# Patient Record
Sex: Female | Born: 1975
Health system: Southern US, Community
[De-identification: ages and names within clinical notes are randomized; demographics above are authoritative.]

## PROBLEM LIST (undated history)

## (undated) DIAGNOSIS — N83209 Unspecified ovarian cyst, unspecified side: Secondary | ICD-10-CM

## (undated) DIAGNOSIS — J309 Allergic rhinitis, unspecified: Secondary | ICD-10-CM

## (undated) DIAGNOSIS — D649 Anemia, unspecified: Secondary | ICD-10-CM

## (undated) DIAGNOSIS — I10 Essential (primary) hypertension: Secondary | ICD-10-CM

## (undated) HISTORY — DX: Essential (primary) hypertension: I10

## (undated) HISTORY — DX: Allergic rhinitis, unspecified: J30.9

## (undated) HISTORY — DX: Unspecified ovarian cyst, unspecified side: N83.209

## (undated) HISTORY — PX: NO PAST SURGERIES: SHX2092

## (undated) HISTORY — DX: Anemia, unspecified: D64.9

---

## 1998-03-10 ENCOUNTER — Encounter: Admission: RE | Admit: 1998-03-10 | Discharge: 1998-03-10 | Payer: Self-pay | Admitting: Sports Medicine

## 1998-04-29 ENCOUNTER — Encounter: Admission: RE | Admit: 1998-04-29 | Discharge: 1998-04-29 | Payer: Self-pay | Admitting: Family Medicine

## 1998-05-08 ENCOUNTER — Encounter: Admission: RE | Admit: 1998-05-08 | Discharge: 1998-05-08 | Payer: Self-pay | Admitting: Family Medicine

## 1998-09-21 ENCOUNTER — Encounter: Admission: RE | Admit: 1998-09-21 | Discharge: 1998-09-21 | Payer: Self-pay | Admitting: Family Medicine

## 1999-01-04 ENCOUNTER — Other Ambulatory Visit: Admission: RE | Admit: 1999-01-04 | Discharge: 1999-01-04 | Payer: Self-pay | Admitting: *Deleted

## 1999-01-04 ENCOUNTER — Encounter: Admission: RE | Admit: 1999-01-04 | Discharge: 1999-01-04 | Payer: Self-pay | Admitting: Family Medicine

## 1999-04-23 ENCOUNTER — Encounter: Admission: RE | Admit: 1999-04-23 | Discharge: 1999-04-23 | Payer: Self-pay | Admitting: Family Medicine

## 1999-05-13 ENCOUNTER — Encounter: Admission: RE | Admit: 1999-05-13 | Discharge: 1999-05-13 | Payer: Self-pay | Admitting: Family Medicine

## 2000-10-02 ENCOUNTER — Encounter: Admission: RE | Admit: 2000-10-02 | Discharge: 2000-10-02 | Payer: Self-pay | Admitting: Family Medicine

## 2000-11-29 ENCOUNTER — Other Ambulatory Visit: Admission: RE | Admit: 2000-11-29 | Discharge: 2000-11-29 | Payer: Self-pay | Admitting: *Deleted

## 2000-11-29 ENCOUNTER — Encounter: Admission: RE | Admit: 2000-11-29 | Discharge: 2000-11-29 | Payer: Self-pay | Admitting: Family Medicine

## 2000-12-07 ENCOUNTER — Encounter: Admission: RE | Admit: 2000-12-07 | Discharge: 2000-12-07 | Payer: Self-pay | Admitting: Family Medicine

## 2000-12-15 ENCOUNTER — Encounter: Admission: RE | Admit: 2000-12-15 | Discharge: 2000-12-15 | Payer: Self-pay | Admitting: Family Medicine

## 2000-12-20 ENCOUNTER — Ambulatory Visit (HOSPITAL_COMMUNITY): Admission: RE | Admit: 2000-12-20 | Discharge: 2000-12-20 | Payer: Self-pay | Admitting: Obstetrics

## 2001-01-09 ENCOUNTER — Encounter: Admission: RE | Admit: 2001-01-09 | Discharge: 2001-01-09 | Payer: Self-pay | Admitting: Family Medicine

## 2001-02-07 ENCOUNTER — Encounter: Admission: RE | Admit: 2001-02-07 | Discharge: 2001-02-07 | Payer: Self-pay | Admitting: Family Medicine

## 2001-02-08 ENCOUNTER — Encounter: Admission: RE | Admit: 2001-02-08 | Discharge: 2001-02-08 | Payer: Self-pay | Admitting: Family Medicine

## 2001-03-09 ENCOUNTER — Encounter: Admission: RE | Admit: 2001-03-09 | Discharge: 2001-03-09 | Payer: Self-pay | Admitting: Family Medicine

## 2001-03-11 ENCOUNTER — Inpatient Hospital Stay (HOSPITAL_COMMUNITY): Admission: AD | Admit: 2001-03-11 | Discharge: 2001-03-11 | Payer: Self-pay | Admitting: Obstetrics & Gynecology

## 2001-03-26 ENCOUNTER — Encounter: Admission: RE | Admit: 2001-03-26 | Discharge: 2001-03-26 | Payer: Self-pay | Admitting: Family Medicine

## 2001-04-11 ENCOUNTER — Encounter: Admission: RE | Admit: 2001-04-11 | Discharge: 2001-04-11 | Payer: Self-pay | Admitting: Family Medicine

## 2001-04-16 ENCOUNTER — Encounter: Admission: RE | Admit: 2001-04-16 | Discharge: 2001-04-16 | Payer: Self-pay | Admitting: Family Medicine

## 2001-04-24 ENCOUNTER — Encounter: Admission: RE | Admit: 2001-04-24 | Discharge: 2001-04-24 | Payer: Self-pay | Admitting: Family Medicine

## 2001-05-02 ENCOUNTER — Encounter: Admission: RE | Admit: 2001-05-02 | Discharge: 2001-05-02 | Payer: Self-pay | Admitting: Family Medicine

## 2001-05-07 ENCOUNTER — Encounter: Admission: RE | Admit: 2001-05-07 | Discharge: 2001-05-07 | Payer: Self-pay | Admitting: Family Medicine

## 2001-05-08 ENCOUNTER — Encounter: Admission: RE | Admit: 2001-05-08 | Discharge: 2001-05-08 | Payer: Self-pay | Admitting: Family Medicine

## 2001-05-10 ENCOUNTER — Inpatient Hospital Stay (HOSPITAL_COMMUNITY): Admission: AD | Admit: 2001-05-10 | Discharge: 2001-05-13 | Payer: Self-pay | Admitting: Obstetrics & Gynecology

## 2001-05-24 ENCOUNTER — Encounter: Admission: RE | Admit: 2001-05-24 | Discharge: 2001-05-24 | Payer: Self-pay | Admitting: Family Medicine

## 2001-05-31 ENCOUNTER — Encounter (INDEPENDENT_AMBULATORY_CARE_PROVIDER_SITE_OTHER): Payer: Self-pay | Admitting: *Deleted

## 2001-05-31 LAB — CONVERTED CEMR LAB

## 2001-06-29 ENCOUNTER — Other Ambulatory Visit: Admission: RE | Admit: 2001-06-29 | Discharge: 2001-06-29 | Payer: Self-pay | Admitting: Family Medicine

## 2001-06-29 ENCOUNTER — Encounter: Admission: RE | Admit: 2001-06-29 | Discharge: 2001-06-29 | Payer: Self-pay | Admitting: Family Medicine

## 2002-05-07 ENCOUNTER — Encounter: Admission: RE | Admit: 2002-05-07 | Discharge: 2002-05-07 | Payer: Self-pay | Admitting: Family Medicine

## 2002-09-17 ENCOUNTER — Encounter: Admission: RE | Admit: 2002-09-17 | Discharge: 2002-09-17 | Payer: Self-pay | Admitting: Family Medicine

## 2002-09-19 ENCOUNTER — Encounter: Payer: Self-pay | Admitting: Sports Medicine

## 2002-09-19 ENCOUNTER — Encounter: Admission: RE | Admit: 2002-09-19 | Discharge: 2002-09-19 | Payer: Self-pay | Admitting: Sports Medicine

## 2003-01-02 ENCOUNTER — Encounter: Admission: RE | Admit: 2003-01-02 | Discharge: 2003-01-02 | Payer: Self-pay | Admitting: Family Medicine

## 2003-01-07 ENCOUNTER — Encounter: Payer: Self-pay | Admitting: Sports Medicine

## 2003-01-07 ENCOUNTER — Encounter: Admission: RE | Admit: 2003-01-07 | Discharge: 2003-01-07 | Payer: Self-pay | Admitting: Sports Medicine

## 2003-06-18 ENCOUNTER — Ambulatory Visit (HOSPITAL_COMMUNITY): Admission: RE | Admit: 2003-06-18 | Discharge: 2003-06-18 | Payer: Self-pay | Admitting: Obstetrics & Gynecology

## 2003-06-18 ENCOUNTER — Encounter: Payer: Self-pay | Admitting: Obstetrics & Gynecology

## 2003-08-20 ENCOUNTER — Encounter: Payer: Self-pay | Admitting: Obstetrics & Gynecology

## 2003-08-20 ENCOUNTER — Ambulatory Visit (HOSPITAL_COMMUNITY): Admission: RE | Admit: 2003-08-20 | Discharge: 2003-08-20 | Payer: Self-pay | Admitting: Obstetrics & Gynecology

## 2003-10-06 ENCOUNTER — Inpatient Hospital Stay (HOSPITAL_COMMUNITY): Admission: AD | Admit: 2003-10-06 | Discharge: 2003-10-06 | Payer: Self-pay | Admitting: Obstetrics & Gynecology

## 2003-10-10 ENCOUNTER — Inpatient Hospital Stay (HOSPITAL_COMMUNITY): Admission: AD | Admit: 2003-10-10 | Discharge: 2003-10-10 | Payer: Self-pay | Admitting: Obstetrics & Gynecology

## 2003-11-03 ENCOUNTER — Ambulatory Visit (HOSPITAL_COMMUNITY): Admission: RE | Admit: 2003-11-03 | Discharge: 2003-11-03 | Payer: Self-pay | Admitting: Obstetrics & Gynecology

## 2004-01-11 ENCOUNTER — Inpatient Hospital Stay (HOSPITAL_COMMUNITY): Admission: AD | Admit: 2004-01-11 | Discharge: 2004-01-13 | Payer: Self-pay | Admitting: Obstetrics

## 2004-01-27 ENCOUNTER — Encounter: Admission: RE | Admit: 2004-01-27 | Discharge: 2004-02-26 | Payer: Self-pay | Admitting: Obstetrics & Gynecology

## 2004-02-27 ENCOUNTER — Encounter: Admission: RE | Admit: 2004-02-27 | Discharge: 2004-03-28 | Payer: Self-pay | Admitting: Obstetrics & Gynecology

## 2004-04-28 ENCOUNTER — Encounter: Admission: RE | Admit: 2004-04-28 | Discharge: 2004-05-28 | Payer: Self-pay | Admitting: Obstetrics & Gynecology

## 2004-06-28 ENCOUNTER — Encounter: Admission: RE | Admit: 2004-06-28 | Discharge: 2004-07-28 | Payer: Self-pay | Admitting: Obstetrics & Gynecology

## 2004-07-29 ENCOUNTER — Encounter: Admission: RE | Admit: 2004-07-29 | Discharge: 2004-08-28 | Payer: Self-pay | Admitting: Obstetrics & Gynecology

## 2004-09-28 ENCOUNTER — Encounter: Admission: RE | Admit: 2004-09-28 | Discharge: 2004-10-28 | Payer: Self-pay | Admitting: Obstetrics & Gynecology

## 2004-11-28 ENCOUNTER — Encounter: Admission: RE | Admit: 2004-11-28 | Discharge: 2004-12-28 | Payer: Self-pay | Admitting: Obstetrics & Gynecology

## 2004-12-29 ENCOUNTER — Encounter: Admission: RE | Admit: 2004-12-29 | Discharge: 2005-01-28 | Payer: Self-pay | Admitting: Obstetrics & Gynecology

## 2005-01-29 ENCOUNTER — Encounter: Admission: RE | Admit: 2005-01-29 | Discharge: 2005-02-28 | Payer: Self-pay | Admitting: Obstetrics & Gynecology

## 2005-03-31 ENCOUNTER — Encounter: Admission: RE | Admit: 2005-03-31 | Discharge: 2005-04-30 | Payer: Self-pay | Admitting: Obstetrics & Gynecology

## 2005-04-12 ENCOUNTER — Other Ambulatory Visit: Admission: RE | Admit: 2005-04-12 | Discharge: 2005-04-12 | Payer: Self-pay | Admitting: Obstetrics and Gynecology

## 2005-05-31 ENCOUNTER — Encounter: Admission: RE | Admit: 2005-05-31 | Discharge: 2005-06-30 | Payer: Self-pay | Admitting: Obstetrics & Gynecology

## 2005-06-10 ENCOUNTER — Inpatient Hospital Stay (HOSPITAL_COMMUNITY): Admission: AD | Admit: 2005-06-10 | Discharge: 2005-06-10 | Payer: Self-pay | Admitting: Obstetrics and Gynecology

## 2005-07-01 ENCOUNTER — Encounter: Admission: RE | Admit: 2005-07-01 | Discharge: 2005-07-30 | Payer: Self-pay | Admitting: Obstetrics & Gynecology

## 2005-07-06 ENCOUNTER — Inpatient Hospital Stay (HOSPITAL_COMMUNITY): Admission: AD | Admit: 2005-07-06 | Discharge: 2005-07-06 | Payer: Self-pay | Admitting: Obstetrics and Gynecology

## 2005-07-31 ENCOUNTER — Encounter: Admission: RE | Admit: 2005-07-31 | Discharge: 2005-08-30 | Payer: Self-pay | Admitting: Obstetrics & Gynecology

## 2005-08-31 ENCOUNTER — Encounter: Admission: RE | Admit: 2005-08-31 | Discharge: 2005-09-29 | Payer: Self-pay | Admitting: Obstetrics & Gynecology

## 2005-09-30 ENCOUNTER — Encounter: Admission: RE | Admit: 2005-09-30 | Discharge: 2005-10-30 | Payer: Self-pay | Admitting: Obstetrics & Gynecology

## 2005-10-31 ENCOUNTER — Encounter: Admission: RE | Admit: 2005-10-31 | Discharge: 2005-11-30 | Payer: Self-pay | Admitting: Obstetrics & Gynecology

## 2005-12-01 ENCOUNTER — Encounter: Admission: RE | Admit: 2005-12-01 | Discharge: 2005-12-28 | Payer: Self-pay | Admitting: Obstetrics & Gynecology

## 2005-12-29 ENCOUNTER — Encounter: Admission: RE | Admit: 2005-12-29 | Discharge: 2006-01-28 | Payer: Self-pay | Admitting: Obstetrics & Gynecology

## 2006-01-29 ENCOUNTER — Encounter: Admission: RE | Admit: 2006-01-29 | Discharge: 2006-02-28 | Payer: Self-pay | Admitting: Obstetrics & Gynecology

## 2006-12-28 DIAGNOSIS — I10 Essential (primary) hypertension: Secondary | ICD-10-CM | POA: Insufficient documentation

## 2006-12-28 DIAGNOSIS — D649 Anemia, unspecified: Secondary | ICD-10-CM | POA: Insufficient documentation

## 2006-12-28 DIAGNOSIS — N83209 Unspecified ovarian cyst, unspecified side: Secondary | ICD-10-CM | POA: Insufficient documentation

## 2006-12-29 ENCOUNTER — Encounter (INDEPENDENT_AMBULATORY_CARE_PROVIDER_SITE_OTHER): Payer: Self-pay | Admitting: *Deleted

## 2007-06-22 ENCOUNTER — Encounter: Admission: RE | Admit: 2007-06-22 | Discharge: 2007-06-22 | Payer: Self-pay | Admitting: Family Medicine

## 2007-10-08 ENCOUNTER — Other Ambulatory Visit: Admission: RE | Admit: 2007-10-08 | Discharge: 2007-10-08 | Payer: Self-pay | Admitting: Family Medicine

## 2010-03-25 ENCOUNTER — Other Ambulatory Visit: Admission: RE | Admit: 2010-03-25 | Discharge: 2010-03-25 | Payer: Self-pay | Admitting: Internal Medicine

## 2010-03-25 ENCOUNTER — Ambulatory Visit: Payer: Self-pay | Admitting: Internal Medicine

## 2010-03-25 DIAGNOSIS — J309 Allergic rhinitis, unspecified: Secondary | ICD-10-CM | POA: Insufficient documentation

## 2010-03-25 LAB — CONVERTED CEMR LAB: Pap Smear: NEGATIVE

## 2010-03-25 LAB — HM PAP SMEAR

## 2010-03-26 ENCOUNTER — Telehealth: Payer: Self-pay | Admitting: Internal Medicine

## 2010-03-26 LAB — CONVERTED CEMR LAB
ALT: 58 units/L — ABNORMAL HIGH (ref 0–35)
AST: 47 units/L — ABNORMAL HIGH (ref 0–37)
Albumin: 4.1 g/dL (ref 3.5–5.2)
Alkaline Phosphatase: 77 units/L (ref 39–117)
BUN: 15 mg/dL (ref 6–23)
Basophils Absolute: 0 10*3/uL (ref 0.0–0.1)
Basophils Relative: 0.4 % (ref 0.0–3.0)
Bilirubin Urine: NEGATIVE
Bilirubin, Direct: 0 mg/dL (ref 0.0–0.3)
CO2: 30 meq/L (ref 19–32)
Calcium: 9.2 mg/dL (ref 8.4–10.5)
Chloride: 105 meq/L (ref 96–112)
Cholesterol: 218 mg/dL — ABNORMAL HIGH (ref 0–200)
Creatinine, Ser: 0.6 mg/dL (ref 0.4–1.2)
Direct LDL: 128.5 mg/dL
Eosinophils Absolute: 0.3 10*3/uL (ref 0.0–0.7)
Eosinophils Relative: 3.4 % (ref 0.0–5.0)
GFR calc non Af Amer: 155.49 mL/min (ref >60)
Glucose, Bld: 85 mg/dL (ref 70–99)
HCT: 36.8 % (ref 36.0–46.0)
HDL: 75.2 mg/dL (ref >39.00)
Hemoglobin, Urine: NEGATIVE
Hemoglobin: 12.5 g/dL (ref 12.0–15.0)
Ketones, ur: NEGATIVE mg/dL
Leukocytes, UA: NEGATIVE
Lymphocytes Relative: 24.7 % (ref 12.0–46.0)
Lymphs Abs: 2.2 10*3/uL (ref 0.7–4.0)
MCHC: 33.9 g/dL (ref 30.0–36.0)
MCV: 84.4 fL (ref 78.0–100.0)
Monocytes Absolute: 0.5 10*3/uL (ref 0.1–1.0)
Monocytes Relative: 6 % (ref 3.0–12.0)
Neutro Abs: 5.8 10*3/uL (ref 1.4–7.7)
Neutrophils Relative %: 65.5 % (ref 43.0–77.0)
Nitrite: NEGATIVE
Platelets: 255 10*3/uL (ref 150.0–400.0)
Potassium: 4.3 meq/L (ref 3.5–5.1)
RBC: 4.37 M/uL (ref 3.87–5.11)
RDW: 14.5 % (ref 11.5–14.6)
Sodium: 139 meq/L (ref 135–145)
Specific Gravity, Urine: 1.01 (ref 1.000–1.030)
TSH: 0.92 microintl units/mL (ref 0.35–5.50)
Total Bilirubin: 0.3 mg/dL (ref 0.3–1.2)
Total CHOL/HDL Ratio: 3
Total Protein, Urine: NEGATIVE mg/dL
Total Protein: 7.2 g/dL (ref 6.0–8.3)
Triglycerides: 41 mg/dL (ref 0.0–149.0)
Urine Glucose: NEGATIVE mg/dL
Urobilinogen, UA: 0.2 (ref 0.0–1.0)
VLDL: 8.2 mg/dL (ref 0.0–40.0)
WBC: 8.9 10*3/uL (ref 4.5–10.5)
pH: 6.5 (ref 5.0–8.0)

## 2010-08-04 ENCOUNTER — Ambulatory Visit: Payer: Self-pay | Admitting: Internal Medicine

## 2010-08-04 DIAGNOSIS — R740 Nonspecific elevation of levels of transaminase and lactic acid dehydrogenase [LDH]: Secondary | ICD-10-CM

## 2010-08-04 DIAGNOSIS — R7402 Elevation of levels of lactic acid dehydrogenase (LDH): Secondary | ICD-10-CM | POA: Insufficient documentation

## 2010-09-03 ENCOUNTER — Telehealth: Payer: Self-pay | Admitting: Internal Medicine

## 2010-09-29 ENCOUNTER — Ambulatory Visit: Payer: Self-pay | Admitting: Internal Medicine

## 2010-09-29 DIAGNOSIS — R6882 Decreased libido: Secondary | ICD-10-CM | POA: Insufficient documentation

## 2010-09-30 LAB — CONVERTED CEMR LAB
ALT: 19 units/L (ref 0–35)
AST: 20 units/L (ref 0–37)
Albumin: 3.5 g/dL (ref 3.5–5.2)
Alkaline Phosphatase: 46 units/L (ref 39–117)
Bilirubin, Direct: 0.1 mg/dL (ref 0.0–0.3)
Total Bilirubin: 0.3 mg/dL (ref 0.3–1.2)
Total Protein: 6.8 g/dL (ref 6.0–8.3)

## 2010-11-08 ENCOUNTER — Telehealth: Payer: Self-pay | Admitting: Internal Medicine

## 2010-11-15 ENCOUNTER — Ambulatory Visit
Admission: RE | Admit: 2010-11-15 | Discharge: 2010-11-15 | Payer: Self-pay | Source: Home / Self Care | Attending: Internal Medicine | Admitting: Internal Medicine

## 2010-12-02 NOTE — Assessment & Plan Note (Signed)
Summary: ?sinus inf/cd   Vital Signs:  Patient profile:   35 year old female Height:      6 inches (15.24 cm) Weight:      142 pounds (64.55 kg) O2 Sat:      99 % on Room air Temp:     99.3 degrees F (37.39 degrees C) oral Pulse rate:   115 / minute BP sitting:   102 / 70  (left arm) Cuff size:   regular  Vitals Entered By: Orlan Leavens RMA (August 04, 2010 1:12 PM)  O2 Flow:  Room air CC: ? Sinus infection, URI symptoms Is Patient Diabetic? No Pain Assessment Patient in pain? no        Primary Care Provider:  Newt Lukes MD  CC:  ? Sinus infection and URI symptoms.  History of Present Illness:  URI Symptoms      This is a 35 year old woman who presents with URI symptoms.  The symptoms began 1 day ago.  The severity is described as moderate.  sudden onset left maxillary sinus and upper jaw/tooth pain - .  The patient reports nasal congestion, purulent nasal discharge, sore throat, and earache, but denies productive cough and sick contacts.  Associated symptoms include use of an antipyretic and response to antipyretic.  The patient denies fever, stiff neck, dyspnea, wheezing, rash, vomiting, and diarrhea.  The patient also reports seasonal symptoms.  The patient denies itchy watery eyes, itchy throat, sneezing, muscle aches, and severe fatigue.  Risk factors for Strep sinusitis include unilateral facial pain, unilateral nasal discharge, tooth pain, and absence of cough.    Current Medications (verified): 1)  Tribenzor 40-5-12.5 Mg Tabs (Olmesartan-Amlodipine-Hctz) .... Take 1 By Mouth Once Daily 2)  Multivitamins  Tabs (Multiple Vitamin) .Marland Kitchen.. 1 By Mouth Once Daily 3)  Perfect Iron 25 Mg Tabs (Carbonyl Iron) .Marland Kitchen.. 1 By Mouth Two Times A Day 4)  Vitamin B-12 2500 Mcg Subl (Cyanocobalamin) .Marland Kitchen.. 1 By Mouth Once Daily 5)  Yaz 3-0.02 Mg Tabs (Drospirenone-Ethinyl Estradiol) .... Use As Directed Once Daily  Allergies (verified): 1)  ! Penicillin  Past History:  Past  Medical History: Ruptured Hemorrhagic R ovarian cyst 11/03 Allergic rhinitis Hypertension Anemia-NOS mild LFTs increase 02/2010 cpx labs - no IVDA or hx hepatitis, no etoh  Review of Systems  The patient denies fever, chest pain, headaches, and abdominal pain.    Physical Exam  General:  alert, well-developed, well-nourished, and cooperative to examination.   nontoxic Head:  Normocephalic and atraumatic without obvious abnormalities. No apparent alopecia or balding. tenderness to palp over left maxillary sinus and upper jaw region Eyes:  vision grossly intact; pupils equal, round and reactive to light.  conjunctiva and lids normal.    Ears:  normal pinnae bilaterally, without erythema, swelling, or tenderness to palpation. TMs clear, without effusion, or cerumen impaction. Hearing grossly normal bilaterally  Mouth:  teeth and gums in good repair; mucous membranes moist, without lesions or ulcers. oropharynx clear without exudate, no erythema. +PND  Lungs:  normal respiratory effort, no intercostal retractions or use of accessory muscles; normal breath sounds bilaterally - no crackles and no wheezes.    Heart:  normal rate, regular rhythm, no murmur, and no rub. BLE without edema.   Impression & Recommendations:  Problem # 1:  ACUTE MAXILLARY SINUSITIS (ICD-461.0)  hx same, usually seasoanl -  abx + nasal steroids - erx done Her updated medication list for this problem includes:    Levaquin 500  Mg Tabs (Levofloxacin) .Marland Kitchen... 1 by mouth once daily x 10 days    Nasonex 50 Mcg/act Susp (Mometasone furoate) .Marland Kitchen... 1 spray each nostril  every morning  Instructed on treatment. Call if symptoms persist or worsen.   Orders: Prescription Created Electronically [phone])  Problem # 2:  NONSPEC ELEVATION OF LEVELS OF TRANSAMINASE/LDH (ICD-790.4) cpx labs 02/2010 - mild inc AST/ALT, no known hx same - no etho, no hepatitis hx, no GI symptoms  offered recheck now - will until f/u CPX labs, sooner  if symptoms - advised on need for f/u and poss abd u/s for same  Complete Medication List: 1)  Tribenzor 40-5-12.5 Mg Tabs (Olmesartan-amlodipine-hctz) .... Take 1 by mouth once daily 2)  Multivitamins Tabs (Multiple vitamin) .Marland Kitchen.. 1 by mouth once daily 3)  Perfect Iron 25 Mg Tabs (Carbonyl iron) .Marland Kitchen.. 1 by mouth two times a day 4)  Vitamin B-12 2500 Mcg Subl (Cyanocobalamin) .Marland Kitchen.. 1 by mouth once daily 5)  Yaz 3-0.02 Mg Tabs (Drospirenone-ethinyl estradiol) .... Use as directed once daily 6)  Levaquin 500 Mg Tabs (Levofloxacin) .Marland Kitchen.. 1 by mouth once daily x 10 days 7)  Nasonex 50 Mcg/act Susp (Mometasone furoate) .Marland Kitchen.. 1 spray each nostril  every morning  Patient Instructions: 1)  it was good to see you today. 2)  levaquin antibioitcs and nasonex spray for sinus symptoms - your prescriptions have been electronically submitted to your pharmacy. Please take as directed. Contact our office if you believe you're having problems with the medication(s).  3)  Get plenty of rest, drink lots of clear liquids, and use Tylenol or Ibuprofen for fever and comfort. Return in 7-10 days if you're not better:sooner if you're feeling worse. 4)  will recheck your liver tests next office visit as dicussed, call sooner if problems Prescriptions: NASONEX 50 MCG/ACT SUSP (MOMETASONE FUROATE) 1 spray each nostril  every morning  #1 x 6   Entered and Authorized by:   Newt Lukes MD   Signed by:   Newt Lukes MD on 08/04/2010   Method used:   [facility] [address] [phone]* (retail)       [address]       [place], [place]  [address]       Ph: [phone] or [phone]       Fax: [phone]   RxID:   [id number] LEVAQUIN 500 MG TABS (LEVOFLOXACIN) 1 by mouth once daily x 10 days  #10 x 0   Entered and Authorized by:   Newt Lukes MD   Signed by:   Newt Lukes MD on 08/04/2010   Method used:   [facility]        [facility] [address] [phone]* (retail)       [address]       [place], [place]  [address]       Ph: [phone] or [phone]       Fax: [phone]   RxID:   [id number]

## 2010-12-02 NOTE — Assessment & Plan Note (Signed)
Summary: NEW [employer] PT--PKG--#-STC   Vital Signs:  Patient profile:   35 year old female Height:      63 inches (160.02 cm) Weight:      142.4 pounds (64.73 kg) BMI:     25.32 O2 Sat:      99 % on Room air Temp:     99.1 degrees F (37.28 degrees C) oral Pulse rate:   110 / minute BP sitting:   132 / 88  (left arm) Cuff size:   regular  Vitals Entered By: Orlan Leavens (Mar 25, 2010 8:16 AM)  O2 Flow:  Room air CC: New patient Is Patient Diabetic? No Pain Assessment Patient in pain? no        Primary Care Provider:  Newt Lukes MD  CC:  New patient.  History of Present Illness: new pt to me and our practice - here to est care  patient is here today for annual physical. Patient feels well and has no complaints.   Preventive Screening-Counseling & Management  Alcohol-Tobacco     Alcohol drinks/day: 0     Alcohol Counseling: not indicated; use of alcohol is not excessive or problematic     Smoking Status: never     Tobacco Counseling: not indicated; no tobacco use  Caffeine-Diet-Exercise     Caffeine Counseling: not indicated; caffeine use is not excessive or problematic     Diet Counseling: not indicated; diet is assessed to be healthy     Does Patient Exercise: yes     Exercise Counseling: not indicated; exercise is adequate  Safety-Violence-Falls     Seat Belt Use: yes     Seat Belt Counseling: not indicated; patient wears seat belts     Helmet Counseling: not applicable     Firearms in the Home: no firearms in the home     Firearm Counseling: not applicable     Smoke Detectors: yes     Smoke Detector Counseling: n/a     Violence Counseling: not indicated; no violence risk noted     Sexual Abuse Counseling: no     Fall Risk Counseling: not indicated; no significant falls noted  Clinical Review Panels:  Prevention   Last Pap Smear:  Done. (05/31/2001)  Immunizations   Last Tetanus Booster:  Done. (02/28/1989)   Current Medications (verified): 1)   Tribenzor 40-5-12.5 Mg Tabs (Olmesartan-Amlodipine-Hctz) .... Take 1 By Mouth Once Daily  Allergies (verified): 1)  ! Penicillin  Past History:  Past Medical History: Ruptured Hemorrhagic R ovarian cyst 11/03 Allergic rhinitis Hypertension Anemia-NOS  Past Surgical History: Denies surgical history  Family History: father had prostate cancer. Mother alive and healthy. Family History Hypertension parent, other relative) Family History Diabetes 1st degree relative (cousin)  Social History: married, lives with spouse and 3 children: Christiane Ha and [place]. Married. No drugs. No ETOH. No smoking. 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 I have reviewed all other systems and they were negative.   Physical Exam  General:  alert, well-developed, well-nourished, and cooperative to examination.    Eyes:  vision grossly intact; pupils equal, round and reactive to light.  conjunctiva and lids normal.    Ears:  normal pinnae bilaterally, without erythema, swelling, or tenderness to palpation. TMs clear, without effusion, or cerumen impaction. Hearing  grossly normal bilaterally  Mouth:  teeth and gums in good repair; mucous membranes moist, without lesions or ulcers. oropharynx clear without exudate, no erythema.  Neck:  supple, full ROM, no masses, no thyromegaly; no thyroid nodules or tenderness. no JVD or carotid bruits.   Lungs:  normal respiratory effort, no intercostal retractions or use of accessory muscles; normal breath sounds bilaterally - no crackles and no wheezes.    Heart:  normal rate, regular rhythm, no murmur, and no rub. BLE without edema. normal DP pulses and normal cap refill in all 4 extremities    Abdomen:  soft, non-tender, normal bowel sounds, no distention; no masses and no appreciable  hepatomegaly or splenomegaly.   Genitalia:  Pelvic Exam:        External: normal female genitalia without lesions or masses        Vagina: normal without lesions or masses        Cervix: normal without lesions or masses        Adnexa: normal bimanual exam without masses or fullness        Uterus: normal by palpation        Pap smear: performed Msk:  No deformity or scoliosis noted of thoracic or lumbar spine.   Neurologic:  alert & oriented X3 and cranial nerves II-XII symetrically intact.  strength normal in all extremities, sensation intact to light touch, and gait normal. speech fluent without dysarthria or aphasia; follows commands with good comprehension.  Skin:  no rashes, vesicles, ulcers, or erythema. No nodules or irregularity to palpation.  Psych:  Oriented X3, memory intact for recent and remote, normally interactive, good eye contact, not anxious appearing, not depressed appearing, and not agitated.      Impression & Recommendations:  Problem # 1:  PREVENTIVE HEALTH CARE (ICD-V70.0)  Patient has been counseled on age-appropriate routine health concerns for screening and prevention. These are reviewed and up-to-date. Immunizations are up-to-date or declined. Labs ordered and will be reviewed.  If no anemia or thyroid problems, consider starting OCP to help regulate fatigue symptoms mid menstral cycle  Orders: TLB-Lipid Panel (80061-LIPID) TLB-BMP (Basic Metabolic Panel-BMET) (80048-METABOL) TLB-CBC Platelet - w/Differential (85025-CBCD) TLB-Hepatic/Liver Function Pnl (80076-HEPATIC) TLB-TSH (Thyroid Stimulating Hormone) (84443-TSH) TLB-Udip w/ Micro (81001-URINE) Pap Smear, Thin Prep ( Collection of) (Z6109)  Problem # 2:  HYPERTENSION (ICD-401.9)  Her updated medication list for this problem includes:    Tribenzor 40-5-12.5 Mg Tabs (Olmesartan-amlodipine-hctz) .Marland Kitchen... Take 1 by mouth once daily  BP today: 132/88  Complete Medication List: 1)  Tribenzor 40-5-12.5 Mg Tabs  (Olmesartan-amlodipine-hctz) .... Take 1 by mouth once daily 2)  Multivitamins Tabs (Multiple vitamin) .Marland Kitchen.. 1 by mouth once daily 3)  Perfect Iron 25 Mg Tabs (Carbonyl iron) .Marland Kitchen.. 1 by mouth two times a day 4)  Vitamin B-12 2500 Mcg Subl (Cyanocobalamin) .Marland Kitchen.. 1 by mouth once daily  Patient Instructions: 1)  it was good to see you today. 2)  test(s) ordered today - your results will be posted on the phone tree for review in 48-72 hours from the time of test completion; call [phone] and enter your 9 digit MRN (listed above on this page, just below your name); if any changes need to be made or there are abnormal results, you will be contacted directly.  3)  continue vitamin supplements and blood pressure pill as discussed - 4)  will send for prior records from the [facility] - 5)  Please schedule a follow-up appointment in 6 months to monitor  blood pressure, sooner if problems.

## 2010-12-02 NOTE — Progress Notes (Signed)
Summary: Rx   Phone Note Call from Patient      New/Updated Medications: YAZ 3-0.02 MG TABS (DROSPIRENONE-ETHINYL ESTRADIOL) use as directed once daily Prescriptions: YAZ 3-0.02 MG TABS (DROSPIRENONE-ETHINYL ESTRADIOL) use as directed once daily  #30 x 5   Entered by:   Margaret Pyle, CMA   Authorized by:   Newt Lukes MD   Signed by:   Margaret Pyle, CMA on 03/26/2010   Method used:   [facility]        [facility] [address] [phone]* (retail)       [address]       [place], [place]  [address]       Ph: [phone] or [phone]       Fax: [phone]   RxID:   [id number]

## 2010-12-02 NOTE — Progress Notes (Signed)
Summary: yeast infection  Phone Note Call from Patient Call back at Home Phone [phone]   Caller: Patient Call For: Newt Lukes MD Reason for Call: Talk to Doctor Summary of Call: Pt left msg on vm stating that she is in [place] and she think she has a yeast infection. Has tried [employer], but still no relief. requesting md to rx something for yeast infection. Pls advise Initial call taken by: Orlan Leavens RMA,  September 03, 2010 10:59 AM  Follow-up for Phone Call        can erx diflucan 150mg  by mouth once daily x 3 days (if pharmacy known) Follow-up by: Newt Lukes MD,  September 03, 2010 11:34 AM  Additional Follow-up for Phone Call Additional follow up Details #1::        Called pt no ansew [facility] RTC Additional Follow-up by: Orlan Leavens RMA,  September 03, 2010 12:00 PM    Additional Follow-up for Phone Call Additional follow up Details #2::    Pt return call back [facility] [place] Called rx into [phone] spoke with Kris/pharmacist. Updated EMR Follow-up by: Orlan Leavens RMA,  September 03, 2010 2:03 PM  New/Updated Medications: FLUCONAZOLE 150 MG TABS (FLUCONAZOLE) take 1 by mouth once daily fro 3 days Prescriptions: FLUCONAZOLE 150 MG TABS (FLUCONAZOLE) take 1 by mouth once daily fro 3 days  #3 x 0   Entered by:   Orlan Leavens RMA   Authorized by:   Newt Lukes MD   Signed by:   Orlan Leavens RMA on 09/03/2010   Method used:   Historical   RxID:   [id number]

## 2010-12-02 NOTE — Assessment & Plan Note (Signed)
Summary: 6 MO ROV /NWS #   Vital Signs:  Patient profile:   35 year old female Weight:      136.8 pounds (62.18 kg) O2 Sat:      99 % on Room air Temp:     99.3 degrees F (37.39 degrees C) oral Pulse rate:   102 / minute BP sitting:   100 / 72  (left arm) Cuff size:   regular  Vitals Entered By: Orlan Leavens RMA (September 29, 2010 8:37 AM)  O2 Flow:  Room air CC: 6 month follow-up Is Patient Diabetic? No Pain Assessment Patient in pain? no        Primary Care Provider:  Newt Lukes MD  CC:  6 month follow-up.  History of Present Illness: here for f/u -  HTN - ?overcontrolled - reports compliance with ongoing medical treatment and no changes in medication dose or frequency. denies adverse side effects related to current therapy.  prev on benicar, lisinopril, labetolol - onset 2006 with complicated pregnancy - now life stress ors much less  c/o low libido -  chronic issue but progressive worse no change BCP no depression, no abuse  inc LFTs last cpx labs - no GI symptoms - no abd pain - no n/v or weight change  Clinical Review Panels:  Lipid Management   Cholesterol:  218 (03/25/2010)   HDL (good cholesterol):  75.20 (03/25/2010)  Complete Metabolic Panel   Glucose:  85 (03/25/2010)   Sodium:  139 (03/25/2010)   Potassium:  4.3 (03/25/2010)   Chloride:  105 (03/25/2010)   CO2:  30 (03/25/2010)   BUN:  15 (03/25/2010)   Creatinine:  0.6 (03/25/2010)   Albumin:  4.1 (03/25/2010)   Total Protein:  7.2 (03/25/2010)   Calcium:  9.2 (03/25/2010)   Total Bili:  0.3 (03/25/2010)   Alk Phos:  77 (03/25/2010)   SGPT (ALT):  58 (03/25/2010)   SGOT (AST):  47 (03/25/2010)   Current Medications (verified): 1)  Tribenzor 40-5-12.5 Mg Tabs (Olmesartan-Amlodipine-Hctz) .... Take 1 By Mouth Once Daily 2)  Multivitamins  Tabs (Multiple Vitamin) .Marland Kitchen.. 1 By Mouth Once Daily 3)  Perfect Iron 25 Mg Tabs (Carbonyl Iron) .Marland Kitchen.. 1 By Mouth Two Times A Day 4)  Vitamin B-12  2500 Mcg Subl (Cyanocobalamin) .Marland Kitchen.. 1 By Mouth Once Daily 5)  Gianvi 3-0.02 Mg Tabs (Drospirenone-Ethinyl Estradiol) .... As Directed 6)  Nasonex 50 Mcg/act Susp (Mometasone Furoate) .Marland Kitchen.. 1 Spray Each Nostril  Every Morning  Allergies (verified): 1)  ! Penicillin  Past History:  Past Medical History: Ruptured Hemorrhagic R ovarian cyst 11/03 Allergic rhinitis Hypertension Anemia-NOS mild LFTs increase 02/2010 cpx labs - no IVDA or hx hepatitis, no alcohol  MD roster:  gyn -  Family History: father had prostate cancer. Mother alive and healthy. Family History Hypertension  - mom and sister Family History Diabetes 1st degree relative (cousin)  Social History: married, lives with spouse and 3 children: Christiane Ha and [place]. Married. No drugs. No ETOH. 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    Physical Exam  General:  alert, well-developed, well-nourished, and cooperative to examination.   Lungs:  normal respiratory effort, no intercostal retractions or use of accessory muscles; normal breath sounds bilaterally - no crackles and no wheezes.    Heart:  normal rate, regular rhythm, no murmur, and no rub. BLE without edema. Neurologic:  alert & oriented X3 and cranial  nerves II-XII symetrically intact.  strength normal in all extremities, sensation intact to light touch, and gait normal. speech fluent without dysarthria or aphasia; follows commands with good comprehension.    Impression & Recommendations:  Problem # 1:  HYPERTENSION (ICD-401.9)  overtx without abn elev BP since tx - reduce med to monotx - Her updated medication list for this problem includes:    Benicar 20 Mg Tabs (Olmesartan medoxomil) .Marland Kitchen... 1 by mouth once daily  BP today: 100/72 Prior BP: 102/70 (08/04/2010)  Labs Reviewed: K+: 4.3 (03/25/2010) Creat: : 0.6 (03/25/2010)   Chol: 218 (03/25/2010)   HDL: 75.20 (03/25/2010)    TG: 41.0 (03/25/2010)  Orders: Prescription Created Electronically [phone])  Problem # 2:  LIBIDO, DECREASED (ICD-799.81)  no depression overlap or other physical issues refer to gyn for same as well as pelvic/PAP eval  Orders: Gynecologic Referral (Gyn)  Problem # 3:  NONSPEC ELEVATION OF LEVELS OF TRANSAMINASE/LDH (ICD-790.4)  Orders: TLB-Hepatic/Liver Function Pnl (80076-HEPATIC)  cpx labs 02/2010 - mild inc AST/ALT, no known hx same - no etho, no hepatitis hx, no GI symptoms  recheck now - advised on need for f/u and poss abd u/s for same  Complete Medication List: 1)  Benicar 20 Mg Tabs (Olmesartan medoxomil) .Marland Kitchen.. 1 by mouth once daily 2)  Multivitamins Tabs (Multiple vitamin) .Marland Kitchen.. 1 by mouth once daily 3)  Perfect Iron 25 Mg Tabs (Carbonyl iron) .Marland Kitchen.. 1 by mouth two times a day 4)  Vitamin B-12 2500 Mcg Subl (Cyanocobalamin) .Marland Kitchen.. 1 by mouth once daily 5)  Gianvi 3-0.02 Mg Tabs (Drospirenone-ethinyl estradiol) .... As directed 6)  Nasonex 50 Mcg/act Susp (Mometasone furoate) .Marland Kitchen.. 1 spray each nostril  every morning  Patient Instructions: 1)  it was good to see you today. 2)  test(s) ordered today - your results will be posted on the phone tree for review in 48-72 hours from the time of test completion; call [phone] and enter your 9 digit MRN (listed above on this page, just below your name); if any changes need to be made or there are abnormal results, you will be contacted directly.  3)  change to benicar (stop tribenzor) - your prescriptions have been electronically submitted to your pharmacy. Please take as directed. Contact our office if you believe you're having problems with the medication(s).  4)  we'll make referral to gyneocology. Our office will contact you regarding this appointment once made.  5)  Please schedule a follow-up appointment in 3 months to monitor BP, sooner if problems.  Prescriptions: BENICAR 20 MG TABS (OLMESARTAN MEDOXOMIL) 1 by mouth once daily   #30 x 6   Entered and Authorized by:   Newt Lukes MD   Signed by:   Newt Lukes MD on 09/29/2010   Method used:   [facility]        [facility] [address] [phone]* (retail)       [address]       [place], [place]  [address]       Ph: [phone] or [phone]       Fax: [phone]   RxID:   [id number]    Orders Added: 1)  TLB-Hepatic/Liver Function Pnl [80076-HEPATIC] 2)  Est. Patient Level IV [38756] 3)  Gynecologic Referral [Gyn] 4)  Prescription Created Electronically [phone]

## 2010-12-02 NOTE — Assessment & Plan Note (Signed)
Summary: SINUS INFECTION/NWS   Vital Signs:  Patient profile:   35 year old female Weight:      136 pounds (61.82 kg) O2 Sat:      97 % on Room air Temp:     99.6 degrees F (37.56 degrees C) oral Pulse rate:   84 / minute BP sitting:   110 / 72  (left arm) Cuff size:   regular  Vitals Entered By: Orlan Leavens RMA (November 15, 2010 2:22 PM)  O2 Flow:  Room air  Primary Care Provider:  Newt Lukes MD  CC:  sinus.  History of Present Illness: here for sinus symptoms  onset 1 week ago predominately on right side, under eye (maxillary) +hx same - recurrent symptoms each season change not using rx'd meds for allg riniits LGF, no ear pain, ST, no cough or SOB, no HA  also reviewed other med issues:  HTN - ?overcontrolled - reports compliance with ongoing medical treatment and no changes in medication dose or frequency. denies adverse side effects related to current therapy.  prev on benicar, lisinopril, labetolol - onset 2006 with complicated pregnancy - now life stress ors much less  c/o low libido -  chronic issue but intermit worse no change BCP no depression, no abuse   Current Medications (verified): 1)  Benicar 20 Mg Tabs (Olmesartan Medoxomil) .Marland Kitchen.. 1 By Mouth Once Daily 2)  Multivitamins  Tabs (Multiple Vitamin) .Marland Kitchen.. 1 By Mouth Once Daily 3)  Perfect Iron 25 Mg Tabs (Carbonyl Iron) .Marland Kitchen.. 1 By Mouth Two Times A Day 4)  Vitamin B-12 2500 Mcg Subl (Cyanocobalamin) .Marland Kitchen.. 1 By Mouth Once Daily 5)  Gianvi 3-0.02 Mg Tabs (Drospirenone-Ethinyl Estradiol) .... As Directed 6)  Nasonex 50 Mcg/act Susp (Mometasone Furoate) .Marland Kitchen.. 1 Spray Each Nostril  Every Morning 7)  Diflucan 150 Mg Tabs (Fluconazole) .Marland Kitchen.. 1 By Mouth X 1 For Yeast Symptoms  Allergies (verified): 1)  ! Penicillin  Past History:  Past Medical History: Ruptured Hemorrhagic R ovarian cyst 11/03 Allergic rhinitis Hypertension Anemia-NOS mild LFTs increase 02/2010 cpx labs - no IVDA or hx hepatitis, no  alcohol   normal on 09/29/10  MD roster:  gyn -   Review of Systems  The patient denies vision loss, decreased hearing, hoarseness, chest pain, syncope, and abdominal pain.         c/o mood irritability  Physical Exam  General:  alert, well-developed, well-nourished, and cooperative to examination.   Head:  Normocephalic and atraumatic without obvious abnormalities. No apparent alopecia or balding. Eyes:  vision grossly intact; pupils equal, round and reactive to light.  conjunctiva and lids normal.    Ears:  normal pinnae bilaterally, without erythema, swelling, or tenderness to palpation. TMs clear, without effusion, or cerumen impaction. Hearing grossly normal bilaterally  Nose:  tender over right max sinus region Mouth:  teeth and gums in good repair; mucous membranes moist, without lesions or ulcers. oropharynx clear without exudate, no erythema. +PND  Lungs:  normal respiratory effort, no intercostal retractions or use of accessory muscles; normal breath sounds bilaterally - no crackles and no wheezes.    Heart:  normal rate, regular rhythm, no murmur, and no rub. BLE without edema.   Impression & Recommendations:  Problem # 1:  ACUTE MAXILLARY SINUSITIS (ICD-461.0)  Her updated medication list for this problem includes:    Nasonex 50 Mcg/act Susp (Mometasone furoate) .Marland Kitchen... 1 spray each nostril  every morning    Azithromycin 250 Mg Tabs (Azithromycin) .Marland KitchenMarland KitchenMarland KitchenMarland Kitchen 2  tabs by mouth today, then 1 by mouth daily starting tomorrow  hx same, usually seasonal -  abx + nasal steroids - erx done  Instructed on treatment. Call if symptoms persist or worsen.   Orders: Prescription Created Electronically [phone])  Complete Medication List: 1)  Benicar 20 Mg Tabs (Olmesartan medoxomil) .Marland Kitchen.. 1 by mouth once daily 2)  Multivitamins Tabs (Multiple vitamin) .Marland Kitchen.. 1 by mouth once daily 3)  Perfect Iron 25 Mg Tabs (Carbonyl iron) .Marland Kitchen.. 1 by mouth two times a day 4)  Vitamin B-12 2500 Mcg Subl  (Cyanocobalamin) .Marland Kitchen.. 1 by mouth once daily 5)  Gianvi 3-0.02 Mg Tabs (Drospirenone-ethinyl estradiol) .... As directed 6)  Nasonex 50 Mcg/act Susp (Mometasone furoate) .Marland Kitchen.. 1 spray each nostril  every morning 7)  Diflucan 150 Mg Tabs (Fluconazole) .Marland Kitchen.. 1 by mouth x 1 for yeast symptoms 8)  Azithromycin 250 Mg Tabs (Azithromycin) .... 2 tabs by mouth today, then 1 by mouth daily starting tomorrow  Patient Instructions: 1)  it was good to see you today.  2)  azithromycin antibiotic for sinus symptoms  - your prescription has been electronically submitted to your pharmacy. Please take as directed. Contact our office if you believe you're having problems with the medication(s). Continue to use the nose spray 3)  Get plenty of rest, drink lots of clear liquids, and use Tylenol or Ibuprofen for fever and comfort. Return in 7-10 days if you're not better:sooner if you're feeling worse. 4)  If you'd like to consider sertraline or other medication for "irritability" as we discussed, make an appointment to condier options for treatment Prescriptions: AZITHROMYCIN 250 MG TABS (AZITHROMYCIN) 2 tabs by mouth today, then 1 by mouth daily starting tomorrow  #6 x 0   Entered and Authorized by:   Newt Lukes MD   Signed by:   Newt Lukes MD on 11/15/2010   Method used:   Electronically to        [facility] [address] [phone]* (retail)       [address]       [place], [place]  [address]       Ph: [phone] or [phone]       Fax: [phone]   RxID:   [id number]    Orders Added: 1)  Est. Patient Level IV [51884] 2)  Prescription Created Electronically [phone]

## 2010-12-02 NOTE — Progress Notes (Signed)
Summary: Yeast Inf  Phone Note Call from Patient Call back at Home Phone [phone]   Caller: Patient Summary of Call: Pt called stating she has a vaginal yeast infections. Pt is requesting MD advisement on treatment and Rx Initial call taken by: Margaret Pyle, CMA,  November 08, 2010 9:41 AM  Follow-up for Phone Call        try otc miconazole (3 day ok) and if ineffective, can take diflucan pill x 1- erx for diflucan done Follow-up by: Newt Lukes MD,  November 08, 2010 9:54 AM  Additional Follow-up for Phone Call Additional follow up Details #1::        Pt informed Additional Follow-up by: Margaret Pyle, CMA,  November 08, 2010 10:03 AM    New/Updated Medications: MONISTAT 3 200-2 MG-% (9GM) KIT (MICONAZOLE NITRATE) as directed on box x 3 days DIFLUCAN 150 MG TABS (FLUCONAZOLE) 1 by mouth x 1 for yeast symptoms Prescriptions: DIFLUCAN 150 MG TABS (FLUCONAZOLE) 1 by mouth x 1 for yeast symptoms  #1 x 0   Entered and Authorized by:   Newt Lukes MD   Signed by:   Newt Lukes MD on 11/08/2010   Method used:   [facility]        [facility] [address] [phone]* (retail)       [address]       [place], [place]  [address]       Ph: [phone] or [phone]       Fax: [phone]   RxID:   [id number]

## 2011-01-05 ENCOUNTER — Ambulatory Visit (INDEPENDENT_AMBULATORY_CARE_PROVIDER_SITE_OTHER): Payer: 59 | Admitting: Internal Medicine

## 2011-01-05 ENCOUNTER — Encounter: Payer: Self-pay | Admitting: Internal Medicine

## 2011-01-05 DIAGNOSIS — J309 Allergic rhinitis, unspecified: Secondary | ICD-10-CM

## 2011-01-05 DIAGNOSIS — I10 Essential (primary) hypertension: Secondary | ICD-10-CM

## 2011-01-05 DIAGNOSIS — R6882 Decreased libido: Secondary | ICD-10-CM

## 2011-01-11 NOTE — Assessment & Plan Note (Signed)
Summary: 3 MO ROV/NWS #   Vital Signs:  Patient profile:   35 year old female Weight:      132.12 pounds (60.05 kg) BMI:     2589.62 O2 Sat:      99 % on Room air Temp:     98.8 degrees F (37.11 degrees C) oral Pulse rate:   90 / minute BP sitting:   112 / 62  (left arm) Cuff size:   regular  Vitals Entered By: Orlan Leavens RMA (January 05, 2011 8:32 AM)  O2 Flow:  Room air CC: 3 month follow-up Is Patient Diabetic? No Pain Assessment Patient in pain? no      Comments Pt states she has'nt started the benicar still been taking Tribenzor   Primary Care Provider:  Newt Lukes MD  CC:  3 month follow-up.  History of Present Illness: here for f/u  HTN - ?overcontrolled - reports compliance with ongoing medical treatment and no changes in medication dose or frequency. denies adverse side effects related to current therapy.  prev on benicar, lisinopril, labetolol - onset 2006 with complicated pregnancy - now life stressors much less  c/o low libido -  chronic issue but intermit worse no change BCP no depression, no abuse  allg rhiniti s- upcoming spring - worst symptoms  - ?otc med to use for eye itch and sneeze + sinus congestion - taking nasal steroid as rx'd   Clinical Review Panels:  CBC   WBC:  8.9 (03/25/2010)   RBC:  4.37 (03/25/2010)   Hgb:  12.5 (03/25/2010)   Hct:  36.8 (03/25/2010)   Platelets:  255.0 (03/25/2010)   MCV  84.4 (03/25/2010)   MCHC  33.9 (03/25/2010)   RDW  14.5 (03/25/2010)   PMN:  65.5 (03/25/2010)   Lymphs:  24.7 (03/25/2010)   Monos:  6.0 (03/25/2010)   Eosinophils:  3.4 (03/25/2010)   Basophil:  0.4 (03/25/2010)  Complete Metabolic Panel   Glucose:  85 (03/25/2010)   Sodium:  139 (03/25/2010)   Potassium:  4.3 (03/25/2010)   Chloride:  105 (03/25/2010)   CO2:  30 (03/25/2010)   BUN:  15 (03/25/2010)   Creatinine:  0.6 (03/25/2010)   Albumin:  3.5 (09/29/2010)   Total Protein:  6.8 (09/29/2010)   Calcium:  9.2  (03/25/2010)   Total Bili:  0.3 (09/29/2010)   Alk Phos:  46 (09/29/2010)   SGPT (ALT):  19 (09/29/2010)   SGOT (AST):  20 (09/29/2010)   Current Medications (verified): 1)  Benicar 20 Mg Tabs (Olmesartan Medoxomil) .Marland Kitchen.. 1 By Mouth Once Daily (Hold) 2)  Multivitamins  Tabs (Multiple Vitamin) .Marland Kitchen.. 1 By Mouth Once Daily 3)  Perfect Iron 25 Mg Tabs (Carbonyl Iron) .Marland Kitchen.. 1 By Mouth Two Times A Day 4)  Vitamin B-12 2500 Mcg Subl (Cyanocobalamin) .Marland Kitchen.. 1 By Mouth Once Daily 5)  Gianvi 3-0.02 Mg Tabs (Drospirenone-Ethinyl Estradiol) .... As Directed 6)  Nasonex 50 Mcg/act Susp (Mometasone Furoate) .Marland Kitchen.. 1 Spray Each Nostril  Every Morning 7)  Tribenzor 40-5-12.5 Mg Tabs (Olmesartan-Amlodipine-Hctz) .... Take 1 By Mouth Once Daily  Allergies (verified): 1)  ! Penicillin  Past History:  Past Medical History: Ruptured Hemorrhagic R ovarian cyst 11/03 Allergic rhinitis Hypertension Anemia-NOS mild LFTs increase 02/2010 cpx labs - no IVDA or hx hepatitis, no alcohol   normal on 09/29/10  MD roster:  gyn -    Review of Systems  The patient denies weight gain, chest pain, syncope, and peripheral edema.  Physical Exam  General:  alert, well-developed, well-nourished, and cooperative to examination.   Lungs:  normal respiratory effort, no intercostal retractions or use of accessory muscles; normal breath sounds bilaterally - no crackles and no wheezes.    Heart:  normal rate, regular rhythm, no murmur, and no rub. BLE without edema. Psych:  Oriented X3, memory intact for recent and remote, normally interactive, good eye contact, not anxious appearing, not depressed appearing, and not agitated.      Impression & Recommendations:  Problem # 1:  HYPERTENSION (ICD-401.9)  The following medications were removed from the medication list:    Tribenzor 40-5-12.5 Mg Tabs (Olmesartan-amlodipine-hctz) .Marland Kitchen... Take 1 by mouth once daily Her updated medication list for this problem includes:     Benicar 20 Mg Tabs (Olmesartan medoxomil) .Marland Kitchen... 1 by mouth once daily (hold)  BP today: 112/62 Prior BP: 110/72 (11/15/2010)  Labs Reviewed: K+: 4.3 (03/25/2010) Creat: : 0.6 (03/25/2010)   Chol: 218 (03/25/2010)   HDL: 75.20 (03/25/2010)   TG: 41.0 (03/25/2010)  concern for overtx without abn elev BP since tx - reduce tribenzor med to monotx - pt willing to do same this time  Problem # 2:  ALLERGIC RHINITIS (ICD-477.9)  Her updated medication list for this problem includes:    Nasonex 50 Mcg/act Susp (Mometasone furoate) .Marland Kitchen... 1 spray each nostril  every morning    Zyrtec Allergy 10 Mg Tbdp (Cetirizine hcl) .Marland Kitchen... 1 by mouth once daily  Discussed use of allergy medications and environmental measures.   Problem # 3:  LIBIDO, DECREASED (ICD-799.81)  Orders: Gynecologic Referral (Gyn)  no depression overlap or other physical issues refer to gyn for same as well as pelvic/PAP eval  Complete Medication List: 1)  Benicar 20 Mg Tabs (Olmesartan medoxomil) .Marland Kitchen.. 1 by mouth once daily (hold) 2)  Multivitamins Tabs (Multiple vitamin) .Marland Kitchen.. 1 by mouth once daily 3)  Perfect Iron 25 Mg Tabs (Carbonyl iron) .Marland Kitchen.. 1 by mouth two times a day 4)  Vitamin B-12 2500 Mcg Subl (Cyanocobalamin) .Marland Kitchen.. 1 by mouth once daily 5)  Gianvi 3-0.02 Mg Tabs (Drospirenone-ethinyl estradiol) .... As directed 6)  Nasonex 50 Mcg/act Susp (Mometasone furoate) .Marland Kitchen.. 1 spray each nostril  every morning 7)  Zyrtec Itchy Eye 0.025 % Soln (Ketotifen fumarate) .... As needed 8)  Zyrtec Allergy 10 Mg Tbdp (Cetirizine hcl) .Marland Kitchen.. 1 by mouth once daily  Patient Instructions: 1)  it was good to see you today. 2)  continue Benicar in place of Tribenzor - 3)  Zyrtec drops and pills for allergy symptoms  4)  we'll make referral to gynecology. Our office will contact you regarding this appointment once made.  5)  Please schedule a follow-up appointment in June for physical and labs, call sooner if problems.    Orders  Added: 1)  Gynecologic Referral [Gyn] 2)  Est. Patient Level IV [[address]109]

## 2011-03-07 ENCOUNTER — Other Ambulatory Visit: Payer: Self-pay | Admitting: Internal Medicine

## 2011-03-18 NOTE — Discharge Summary (Signed)
[facility]  Patient:    [name], [name]                     MRN: [id number] Adm. Date:  [id number] Disc. Date: [id number] Attending:  Antionette Char                           Discharge Summary  DATE OF BIRTH:                05-Sep-1976  ADMISSION DIAGNOSES:          1. Hypertension, asymptomatic and                                  pregnancy-induced hypertension laboratories                                  within normal limits.  Endoproteinuria                                  patient not begun on magnesium.                               2. Cervix rechecked at 3, 70%, and -3 vertex.                                  Patient for admission, augmented                                  cervix if contractions decrease.                               3. Fetus not reactive but CST negative.  DISCHARGE DIAGNOSES:          1. Status post normal spontaneous vaginal                                  delivery.                               2. Delivery of [name] viable female infant.                               3. Status post preeclampsia.  ATTENDING:                    Roseanna Rainbow, M.D.  RESIDENTS:                    1. Dr. Jonah Blue.                               2. Dr. Neysa Bonito.  3. Dr. Santiago Bumpers.  REFERRING PHYSICIAN:          Dr. Santiago Bumpers [facility].  CONSULTATIONS:                None.  PROCEDURES:                   Magnesium prophylaxis given intrapartum secondary to increased blood pressures and continued postpartum.  HISTORY OF PRESENT ILLNESS:   This is [name] 35 year old G2, P1, at 39-2/7 by second trimester ultrasound who presents with [name] labor check, uterine contractions every four minutes for an hour, irregular contractions since 4 [name].m. that morning.  The patient presented with contractions, no leakage of fluid, no vaginal bleeding, and positive fetal activity.  At  admission the patient denied headache, facial or hand swelling, right upper quadrant pain, visual disturbance, but has had increased blood pressure for the last two clinic visits, asymptomatic.  PIH laboratories unremarkable.  Trace proteinuria on clean-catch.  PRENATAL COURSE:              The patient was seen at [facility] with [name] history of increased blood pressure with first pregnancy, treated with magnesium sulfate and labor.  The patient did take prenatal vitamins and had no allergies.  OBSTETRICAL HISTORY:          NSVD at term in 1998, during which she had elevated BPs.  PAST MEDICAL HISTORY:         No significant past medical history.  PRENATAL LABORATORY DATA:     Blood type O positive, antibody negative. Platelets 255.  Rubella immune.  Hepatitis B surface antigen negative.  RPR negative.  HIV negative.  GBS negative.  PHYSICAL EXAMINATION:  VITAL SIGNS:                  Temperature 99.4, pulse 100, respirations 18, blood pressure 147/94, repeated 130/78, repeated 137/84.  GENERAL:                      Patient in pain with contractions.  HEART:                        Regular rate and rhythm, without murmur.  LUNGS:                        Clear to auscultation bilaterally.  ABDOMEN:                      Soft, gravid nontender.  EXTREMITIES:                  No edema.  NEUROLOGIC:                   DTRs 2+, without clonus.  PELVIC:                       Speculum exam was deferred.  Digital cervical exam showed [name] loose 1 cm cervix, 50% effaced, with [name] high station and vertex. Fetal heart rate baseline was in the 130s, without reactivity, positive variability, no decelerations, contractions every three to four minutes.  ADMISSION LABORATORY DATA:    Sodium 139, potassium 3.7, chloride 105, CO2 22, BUN 3, creatinine 0.6, glucose 80, calcium 9.1, total protein 6.9, albumin 3.3, AST 24, ALT 21, alkaline phosphatase 219, total bilirubin 0.4.  LDH  180, uric  acid 3.3.  WBC 12.7, platelets 206, hemoglobin and hematocrit 12.5 and 37.3.  Catheterized UA showed no protein, completely negative, within normal limits except for 15 ketones.  ADMISSION ASSESSMENT:         [age]-year-old G2, P1, at 39-2/7, in latent first-stage of labor with diagnoses as above.  PLAN:                         Admit the patient and augment if contractions decrease and not to start on magnesium at admission given stable hypertension, asymptomatic, and prenatal laboratories within normal limits without proteinuria.  HOSPITAL COURSE:              On May 11, 2001, the patient delivered, normal spontaneous vaginal delivery, [name] viable female infant, Apgars of 8 at one and 9 at five, [place] for meconium-stained fluid, but the infant was vigorous at delivery.  Placenta delivered spontaneously intact.  Three-vessel cord, no laxity.  Estimated blood loss 250 cc.  Magnesium was given intrapartum secondary to increased blood pressures, 160s-170s/110s, and was continued postpartum.  All PIH laboratories stayed within normal limits, and postpartum care was routine.  Magnesium sulfate was discontinued on May 12, 2001, with laboratories rechecked on May 13, 2001.  On May 13, 2001, laboratories were still within normal limits.  The patient was diuresing well and did not complain of elevated blood pressures or any kind of neurologic symptoms, so preeclampsia was, apparently, resolved, and the patient was ready for discharge.  CONDITION ON DISCHARGE:       Good.  DISPOSITION:                  Patient discharged to home with infant.  MEDICATIONS:                  1. Ibuprofen 600 mg p.o. q.6h. p.r.n. pain, #30,                                  1 refill.                               2. Prenatal vitamins 1 p.o. q.d.                               3. Micronor starting on May 27, 2001, 1 p.o.                                  q.d., with three refills.  DISCHARGE  INSTRUCTIONS:       The patient was given routine instructions for activity, diet, and return to clinic.   FOLLOW-UP:                    The patient is to follow up with Dr. Peter Minium at the family practice clinic in six weeks. DD:  05/13/01 TD:  05/13/01 Job: [id number]

## 2011-03-26 ENCOUNTER — Other Ambulatory Visit: Payer: Self-pay | Admitting: Internal Medicine

## 2011-04-11 ENCOUNTER — Encounter: Payer: 59 | Admitting: Internal Medicine

## 2011-04-11 DIAGNOSIS — Z0289 Encounter for other administrative examinations: Secondary | ICD-10-CM

## 2011-04-30 ENCOUNTER — Encounter: Payer: Self-pay | Admitting: Internal Medicine

## 2011-04-30 DIAGNOSIS — J309 Allergic rhinitis, unspecified: Secondary | ICD-10-CM

## 2011-08-13 ENCOUNTER — Other Ambulatory Visit: Payer: Self-pay | Admitting: Internal Medicine

## 2011-08-15 NOTE — Telephone Encounter (Signed)
Done per emr 

## 2011-09-12 ENCOUNTER — Telehealth: Payer: Self-pay | Admitting: *Deleted

## 2011-09-12 MED ORDER — MOMETASONE FUROATE 50 MCG/ACT NA SUSP: 1.0000 | Freq: Every day | NASAL | Status: DC

## 2011-09-12 NOTE — Telephone Encounter (Signed)
Done

## 2011-10-07 ENCOUNTER — Ambulatory Visit: Payer: 59 | Admitting: Internal Medicine

## 2011-10-11 ENCOUNTER — Ambulatory Visit: Payer: 59 | Admitting: Internal Medicine

## 2011-10-11 DIAGNOSIS — Z0289 Encounter for other administrative examinations: Secondary | ICD-10-CM

## 2011-10-19 ENCOUNTER — Ambulatory Visit (INDEPENDENT_AMBULATORY_CARE_PROVIDER_SITE_OTHER): Payer: 59 | Admitting: Internal Medicine

## 2011-10-19 ENCOUNTER — Other Ambulatory Visit: Payer: Self-pay | Admitting: Internal Medicine

## 2011-10-19 ENCOUNTER — Encounter: Payer: Self-pay | Admitting: Internal Medicine

## 2011-10-19 VITALS — BP 130/70 | HR 72 | Temp 98.5°F | Resp 16

## 2011-10-19 DIAGNOSIS — I10 Essential (primary) hypertension: Secondary | ICD-10-CM

## 2011-10-19 DIAGNOSIS — J0100 Acute maxillary sinusitis, unspecified: Secondary | ICD-10-CM

## 2011-10-19 DIAGNOSIS — J309 Allergic rhinitis, unspecified: Secondary | ICD-10-CM

## 2011-10-19 MED ORDER — OLMESARTAN-AMLODIPINE-HCTZ 40-5-12.5 MG PO TABS: 1.0000 | ORAL_TABLET | Freq: Every day | ORAL | Status: DC

## 2011-10-19 MED ORDER — AZITHROMYCIN 250 MG PO TABS: ORAL_TABLET | ORAL | Status: AC

## 2011-10-19 MED ORDER — LORATADINE 10 MG PO TABS: 10.0000 mg | ORAL_TABLET | Freq: Every day | ORAL | Status: DC

## 2011-10-19 MED ORDER — PROMETHAZINE-CODEINE 6.25-10 MG/5ML PO SYRP: 5.0000 mL | ORAL_SOLUTION | ORAL | Status: AC | PRN

## 2011-10-19 MED ORDER — MOMETASONE FUROATE 50 MCG/ACT NA SUSP: 1.0000 | Freq: Every day | NASAL | Status: DC

## 2011-10-19 MED ORDER — FLUCONAZOLE 150 MG PO TABS: 150.0000 mg | ORAL_TABLET | Freq: Once | ORAL | Status: AC

## 2011-10-19 NOTE — Patient Instructions (Signed)
Use over-the-counter  "cold" medicines  such as"Afrin" nasal spray for nasal congestion as directed instead. Use" Delsym" or" Robitussin" cough syrup varietis for cough.  You can use plain "Tylenol" or "Advil' for fever, chills and achyness.   

## 2011-10-19 NOTE — Assessment & Plan Note (Signed)
Continue with current prescription therapy as reflected on the Med list.  

## 2011-10-19 NOTE — Assessment & Plan Note (Signed)
1212

## 2011-10-19 NOTE — Assessment & Plan Note (Signed)
Start Claritin qd Cont nasal spray Netty pot

## 2011-10-19 NOTE — Progress Notes (Signed)
  Subjective:    Patient ID: [name], female    DOB: [birth year], 35 y.o.   MRN: [id number]  HPI   HPI  C/o URI sx's x  5 days. C/o ST, cough, weakness. Not better with OTC medicines. Actually, the patient is getting worse. The patient did not sleep last night due to cough. F/u HTN C/o allergies  Review of Systems  Constitutional: Positive for fever, chills and fatigue.  HENT: Positive for congestion, rhinorrhea, sneezing and postnasal drip.   Eyes: Positive for photophobia and pain. Negative for discharge and visual disturbance.  Respiratory: Positive for cough Gastrointestinal: Negative for vomiting, abdominal pain, diarrhea and abdominal distention.  Genitourinary: Negative for dysuria and difficulty urinating.  Skin: Negative for rash.  Neurological: Positive for dizziness, weakness and light-headedness.      Review of Systems     Objective:   Physical Exam  Constitutional: She appears well-developed. No distress.  HENT:  Head: Normocephalic.  Right Ear: External ear normal.  Left Ear: External ear normal.  Nose: Nose normal.       eryth mucosa  Eyes: Conjunctivae are normal. Pupils are equal, round, and reactive to light. Right eye exhibits no discharge. Left eye exhibits no discharge.  Neck: Normal range of motion. Neck supple. No JVD present. No tracheal deviation present. No thyromegaly present.  Cardiovascular: Normal rate, regular rhythm and normal heart sounds.   Pulmonary/Chest: No stridor. No respiratory distress. She has no wheezes.  Abdominal: Soft. Bowel sounds are normal. She exhibits no distension and no mass. There is no tenderness. There is no rebound and no guarding.  Musculoskeletal: She exhibits no edema and no tenderness.  Lymphadenopathy:    She has no cervical adenopathy.  Neurological: She displays normal reflexes. No cranial nerve deficit. She exhibits normal muscle tone. Coordination normal.  Skin: No rash noted. No erythema.    Psychiatric: She has a normal mood and affect. Her behavior is normal. Judgment and thought content normal.          Assessment & Plan:

## 2011-12-30 ENCOUNTER — Ambulatory Visit (INDEPENDENT_AMBULATORY_CARE_PROVIDER_SITE_OTHER): Payer: 59 | Admitting: Internal Medicine

## 2011-12-30 ENCOUNTER — Encounter: Payer: Self-pay | Admitting: Internal Medicine

## 2011-12-30 VITALS — BP 100/72 | HR 103 | Temp 98.0°F

## 2011-12-30 DIAGNOSIS — J069 Acute upper respiratory infection, unspecified: Secondary | ICD-10-CM

## 2011-12-30 MED ORDER — DOXYCYCLINE HYCLATE 100 MG PO TABS: 100.0000 mg | ORAL_TABLET | Freq: Two times a day (BID) | ORAL | Status: DC

## 2011-12-30 MED ORDER — IBUPROFEN 800 MG PO TABS: 800.0000 mg | ORAL_TABLET | Freq: Three times a day (TID) | ORAL | Status: AC | PRN

## 2011-12-30 MED ORDER — HYDROCODONE-HOMATROPINE 5-1.5 MG/5ML PO SYRP: 5.0000 mL | ORAL_SOLUTION | Freq: Four times a day (QID) | ORAL | Status: AC | PRN

## 2011-12-30 NOTE — Patient Instructions (Signed)
It was good to see you today. Doxycycline antibiotics, ibuprofen and cough syrup - Your prescription(s) have been submitted to your pharmacy. Please take as directed and contact our office if you believe you are having problem(s) with the medication(s). Rest, hydrated and continue your usual sinus care as ongoing

## 2011-12-30 NOTE — Progress Notes (Signed)
  Subjective:    HPI  complains of head cold symptoms  Onset >1 week ago, wax/wane symptoms  Initially associated with rhinorrhea, sneezing, sore throat, mild headache and low grade fever Now myalgias, sinus pressure and mild-mod chest congestion No relief with OTC meds Precipitated by sick contacts  Past Medical History  Diagnosis Date  . ALLERGIC RHINITIS   . HYPERTENSION, BENIGN SYSTEMIC   . HYPERTENSION   . OVARIAN CYST   . Unspecified Anemia     Review of Systems Constitutional: chronic night sweats > 6 mo, no unexpected weight change Pulmonary: No pleurisy or hemoptysis Cardiovascular: No chest pain or palpitations     Objective:   Physical Exam BP 100/72  Pulse 103  Temp(Src) 98 F (36.7 C) (Oral)  SpO2 97% Wt Readings from Last 3 Encounters:  01/05/11 132 lb 1.9 oz (59.929 kg)  11/15/10 136 lb (61.689 kg)  09/29/10 136 lb 12.8 oz (62.052 kg)    GEN: mildly ill appearing and audible head/chest congestion HENT: NCAT, mild sinus tenderness bilaterally, nares with clear discharge, oropharynx mild erythema, no exudate Eyes: Vision grossly intact, no conjunctivitis Lungs: Clear to auscultation without rhonchi or wheeze, no increased work of breathing Cardiovascular: Regular rate and rhythm, no bilateral edema  Lab Results  Component Value Date   WBC 8.9 03/25/2010   HGB 12.5 03/25/2010   HCT 36.8 03/25/2010   PLT 255.0 03/25/2010   GLUCOSE 85 03/25/2010   CHOL 218* 03/25/2010   TRIG 41.0 03/25/2010   HDL 75.20 03/25/2010   LDLDIRECT 128.5 03/25/2010   ALT 19 09/29/2010   AST 20 09/29/2010   NA 139 03/25/2010   K 4.3 03/25/2010   CL 105 03/25/2010   CREATININE 0.6 03/25/2010   BUN 15 03/25/2010   CO2 30 03/25/2010   TSH 0.92 03/25/2010       Assessment & Plan:  Viral URI  Cough, postnasal drip related to above   Empiric antibiotics prescribed due to symptom duration greater than 7 days Prescription cough suppression - new prescriptions done Symptomatic  care with Tylenol or Advil, hydration and rest -  salt gargle advised as needed

## 2012-01-04 ENCOUNTER — Telehealth: Payer: Self-pay

## 2012-01-04 MED ORDER — AZITHROMYCIN 250 MG PO TABS: ORAL_TABLET | ORAL | Status: AC

## 2012-01-04 NOTE — Telephone Encounter (Signed)
Pt advised of ABX change and benadrly per box instruction for sxs.

## 2012-01-04 NOTE — Telephone Encounter (Signed)
Pt called stating that ABX has caused a rash, itching and mild swelling of the face. Pt is requesting an alternative ABX, please advise.

## 2012-01-04 NOTE — Telephone Encounter (Signed)
Stopped Doxy, I added Doxy to allergy list.  Z-Pak erx done

## 2012-02-16 ENCOUNTER — Ambulatory Visit: Payer: 59 | Admitting: Internal Medicine

## 2012-02-20 ENCOUNTER — Encounter: Payer: Self-pay | Admitting: Internal Medicine

## 2012-02-20 ENCOUNTER — Ambulatory Visit (INDEPENDENT_AMBULATORY_CARE_PROVIDER_SITE_OTHER): Payer: 59 | Admitting: Internal Medicine

## 2012-02-20 ENCOUNTER — Other Ambulatory Visit (INDEPENDENT_AMBULATORY_CARE_PROVIDER_SITE_OTHER): Payer: 59

## 2012-02-20 VITALS — BP 110/80 | HR 97 | Temp 98.8°F | Resp 16 | Wt 138.0 lb

## 2012-02-20 DIAGNOSIS — N926 Irregular menstruation, unspecified: Secondary | ICD-10-CM

## 2012-02-20 DIAGNOSIS — R6882 Decreased libido: Secondary | ICD-10-CM

## 2012-02-20 DIAGNOSIS — Z0000 Encounter for general adult medical examination without abnormal findings: Secondary | ICD-10-CM

## 2012-02-20 DIAGNOSIS — Z23 Encounter for immunization: Secondary | ICD-10-CM

## 2012-02-20 LAB — TSH: TSH: 0.61 u[IU]/mL (ref 0.35–5.50)

## 2012-02-20 LAB — HEPATIC FUNCTION PANEL
ALT: 29 U/L (ref 0–35)
AST: 31 U/L (ref 0–37)
Albumin: 4.3 g/dL (ref 3.5–5.2)
Alkaline Phosphatase: 63 U/L (ref 39–117)
Bilirubin, Direct: 0.1 mg/dL (ref 0.0–0.3)
Total Bilirubin: 0.5 mg/dL (ref 0.3–1.2)
Total Protein: 8 g/dL (ref 6.0–8.3)

## 2012-02-20 LAB — CBC WITH DIFFERENTIAL/PLATELET
Basophils Absolute: 0.1 10*3/uL (ref 0.0–0.1)
Basophils Relative: 1.1 % (ref 0.0–3.0)
Eosinophils Absolute: 0.2 10*3/uL (ref 0.0–0.7)
Eosinophils Relative: 2.3 % (ref 0.0–5.0)
HCT: 37.3 % (ref 36.0–46.0)
Hemoglobin: 12.4 g/dL (ref 12.0–15.0)
Lymphocytes Relative: 31.4 % (ref 12.0–46.0)
Lymphs Abs: 2.6 10*3/uL (ref 0.7–4.0)
MCHC: 33.3 g/dL (ref 30.0–36.0)
MCV: 81.6 fl (ref 78.0–100.0)
Monocytes Absolute: 0.5 10*3/uL (ref 0.1–1.0)
Monocytes Relative: 6.1 % (ref 3.0–12.0)
Neutro Abs: 4.9 10*3/uL (ref 1.4–7.7)
Neutrophils Relative %: 59.1 % (ref 43.0–77.0)
Platelets: 237 10*3/uL (ref 150.0–400.0)
RBC: 4.58 Mil/uL (ref 3.87–5.11)
RDW: 13.9 % (ref 11.5–14.6)
WBC: 8.3 10*3/uL (ref 4.5–10.5)

## 2012-02-20 LAB — BASIC METABOLIC PANEL
BUN: 12 mg/dL (ref 6–23)
CO2: 23 mEq/L (ref 19–32)
Calcium: 9.6 mg/dL (ref 8.4–10.5)
Chloride: 108 mEq/L (ref 96–112)
Creatinine, Ser: 0.7 mg/dL (ref 0.4–1.2)
GFR: 119.71 mL/min (ref >60.00)
Glucose, Bld: 103 mg/dL — ABNORMAL HIGH (ref 70–99)
Potassium: 4 mEq/L (ref 3.5–5.1)
Sodium: 143 mEq/L (ref 135–145)

## 2012-02-20 LAB — TESTOSTERONE: Testosterone: 29.64 ng/dL (ref 10.00–70.00)

## 2012-02-20 LAB — LIPID PANEL
Cholesterol: 248 mg/dL — ABNORMAL HIGH (ref 0–200)
HDL: 95.1 mg/dL (ref >39.00)
Total CHOL/HDL Ratio: 3
Triglycerides: 59 mg/dL (ref 0.0–149.0)
VLDL: 11.8 mg/dL (ref 0.0–40.0)

## 2012-02-20 LAB — PROLACTIN: Prolactin: 4.5 ng/mL

## 2012-02-20 MED ORDER — ALPRAZOLAM 0.5 MG PO TABS: 0.2500 mg | ORAL_TABLET | Freq: Every evening | ORAL | Status: AC | PRN

## 2012-02-20 NOTE — Patient Instructions (Signed)
It was good to see you today. Test(s) ordered today. Your results will be called to you after review (48-72hours after test completion). If any changes need to be made, you will be notified at that time. Medications reviewed, no changes at this time. Refill on xanax as needed Health Maintenance reviewed - tetanus (Tdap) booster given - all other recommended immunizations and age-appropriate screenings are up-to-date.  Talk with Dr Marcelle Overlie as discussed and review these lab results with him Please schedule followup in 6-12 weeks to review possible meds for depression/anxiety, call sooner if problems.

## 2012-02-20 NOTE — Assessment & Plan Note (Signed)
Chronic issue - irregular periods ?hormonal (inconsistent BCP use, symptoms worse when off contraception) associated with irritability, poor sleep, night sweats and tearfulness ?depression/axiety overlap or possible premature menopause - Check hormones now - advise follow up gyn on same If still symptomatic, f/u 6-12 weeks to consider wellbutrin or SNRI treatment options Renew xanax to use prn sleep

## 2012-02-20 NOTE — Progress Notes (Signed)
Subjective:    Patient ID: [name], female    DOB: 1976/10/17, 36 y.o.   MRN: [id number]  HPI patient is here today for annual physical. Patient feels well overall  complains of continued low libido - increasing tearfulness and insomnia - using low dose xanax prn associated with irritability, hot flashes, irregular period and distractability Denies prior depression/anxiety but would be willing to consider med tx for same  Past Medical History  Diagnosis Date  . ALLERGIC RHINITIS   . HYPERTENSION, BENIGN SYSTEMIC   . HYPERTENSION   . OVARIAN CYST   . Unspecified Anemia    Family History  Problem Relation Age of Onset  . Hypertension Mother   . Cancer Father     Prostate  . Hypertension Sister   . Diabetes Cousin    History  Substance Use Topics  . Smoking status: Never Smoker   . Smokeless tobacco: Not on file   Comment: Married, lives with spouse and 3 children ; Christiane Ha and [place].   . Alcohol Use: No    Review of Systems Constitutional: Negative for fever or weight change.  Respiratory: Negative for cough and shortness of breath.   Cardiovascular: Negative for chest pain or palpitations.  Gastrointestinal: Negative for abdominal pain, no bowel changes.  Musculoskeletal: Negative for gait problem or joint swelling.  Skin: Negative for rash.  Neurological: Negative for dizziness or headache.  No other specific complaints in a complete review of systems (except as listed in HPI above).     Objective:   Physical Exam BP 110/80  Pulse 97  Temp(Src) 98.8 F (37.1 C) (Oral)  Resp 16  Wt 138 lb (62.596 kg)  SpO2 99%  LMP 02/13/2012 Wt Readings from Last 3 Encounters:  02/20/12 138 lb (62.596 kg)  01/05/11 132 lb 1.9 oz (59.929 kg)  11/15/10 136 lb (61.689 kg)   Constitutional: She appears well-developed and well-nourished. No distress.  HENT: Head: Normocephalic and atraumatic. Ears: B TMs ok, no erythema or effusion; Nose: Nose normal.    Mouth/Throat: Oropharynx is clear and moist. No oropharyngeal exudate.  Eyes: Conjunctivae and EOM are normal. Pupils are equal, round, and reactive to light. No scleral icterus.  Neck: Normal range of motion. Neck supple. No JVD present. No thyromegaly present.  Cardiovascular: Normal rate, regular rhythm and normal heart sounds.  No murmur heard. No BLE edema. Pulmonary/Chest: Effort normal and breath sounds normal. No respiratory distress. She has no wheezes.  Abdominal: Soft. Bowel sounds are normal. She exhibits no distension. There is no tenderness. no masses Musculoskeletal: Normal range of motion, no joint effusions. No gross deformities Neurological: She is alert and oriented to person, place, and time. No cranial nerve deficit. Coordination normal.  Skin: Skin is warm and dry. No rash noted. No erythema.  Psychiatric: She has a normal mood and affect. Her behavior is normal. Judgment and thought content normal.   Lab Results  Component Value Date   WBC 8.9 03/25/2010   HGB 12.5 03/25/2010   HCT 36.8 03/25/2010   PLT 255.0 03/25/2010   GLUCOSE 85 03/25/2010   CHOL 218* 03/25/2010   TRIG 41.0 03/25/2010   HDL 75.20 03/25/2010   LDLDIRECT 128.5 03/25/2010   ALT 19 09/29/2010   AST 20 09/29/2010   NA 139 03/25/2010   K 4.3 03/25/2010   CL 105 03/25/2010   CREATININE 0.6 03/25/2010   BUN 15 03/25/2010   CO2 30 03/25/2010   TSH 0.92 03/25/2010  Assessment & Plan:  CPX/v70.0 - Patient has been counseled on age-appropriate routine health concerns for screening and prevention. These are reviewed and up-to-date. Immunizations are up-to-date or declined. Labs ordered and will be reviewed.  Also See problem list. Medications and labs reviewed today.

## 2012-02-21 LAB — FOLLICLE STIMULATING HORMONE: FSH: 6.9 m[IU]/mL

## 2012-02-21 LAB — LDL CHOLESTEROL, DIRECT: Direct LDL: 133.3 mg/dL

## 2012-03-19 ENCOUNTER — Ambulatory Visit: Payer: 59 | Admitting: Internal Medicine

## 2012-03-19 DIAGNOSIS — Z0289 Encounter for other administrative examinations: Secondary | ICD-10-CM

## 2012-05-21 ENCOUNTER — Ambulatory Visit: Payer: 59 | Admitting: Internal Medicine

## 2012-05-21 DIAGNOSIS — Z0289 Encounter for other administrative examinations: Secondary | ICD-10-CM

## 2012-06-13 ENCOUNTER — Other Ambulatory Visit: Payer: Self-pay | Admitting: Internal Medicine

## 2012-08-09 ENCOUNTER — Telehealth: Payer: Self-pay | Admitting: *Deleted

## 2012-08-09 DIAGNOSIS — Z0000 Encounter for general adult medical examination without abnormal findings: Secondary | ICD-10-CM

## 2012-08-09 NOTE — Telephone Encounter (Signed)
Labs placed into Epic for upcoming appointment 

## 2012-08-09 NOTE — Telephone Encounter (Signed)
Message copied by Carin Primrose on Thu Aug 09, 2012  8:35 AM ------      Message from: COUSIN, SHARON T      Created: Thu Aug 02, 2012 12:55 PM      Regarding: PHY DATE  09/26/12       THANKS

## 2012-09-04 ENCOUNTER — Encounter: Payer: Self-pay | Admitting: Internal Medicine

## 2012-09-04 ENCOUNTER — Ambulatory Visit (INDEPENDENT_AMBULATORY_CARE_PROVIDER_SITE_OTHER): Payer: 59 | Admitting: Internal Medicine

## 2012-09-04 VITALS — BP 116/82 | HR 91 | Temp 98.2°F | Ht 65.0 in | Wt 144.0 lb

## 2012-09-04 DIAGNOSIS — M549 Dorsalgia, unspecified: Secondary | ICD-10-CM

## 2012-09-04 DIAGNOSIS — M62838 Other muscle spasm: Secondary | ICD-10-CM

## 2012-09-04 DIAGNOSIS — Z041 Encounter for examination and observation following transport accident: Secondary | ICD-10-CM

## 2012-09-04 MED ORDER — CYCLOBENZAPRINE HCL 10 MG PO TABS: 10.0000 mg | ORAL_TABLET | Freq: Three times a day (TID) | ORAL | Status: DC | PRN

## 2012-09-04 MED ORDER — DICLOFENAC SODIUM 75 MG PO TBEC: 75.0000 mg | DELAYED_RELEASE_TABLET | Freq: Two times a day (BID) | ORAL | Status: DC
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
## 2012-09-04 NOTE — Patient Instructions (Signed)
Back Pain, Adult Low back pain is very common. About 1 in 5 people have back pain. The cause of low back pain is rarely dangerous. The pain often gets better over time. About half of people with a sudden onset of back pain feel better in just 2 weeks. About 8 in 10 people feel better by 6 weeks.   CAUSES Some common causes of back pain include:  Strain of the muscles or ligaments supporting the spine.   Wear and tear (degeneration) of the spinal discs.   Arthritis.   Direct injury to the back.  DIAGNOSIS Most of the time, the direct cause of low back pain is not known. However, back pain can be treated effectively even when the exact cause of the pain is unknown. Answering your caregiver's questions about your overall health and symptoms is one of the most accurate ways to make sure the cause of your pain is not dangerous. If your caregiver needs more information, he or she may order lab work or imaging tests (X-rays or MRIs). However, even if imaging tests show changes in your back, this usually does not require surgery. HOME CARE INSTRUCTIONS For many people, back pain returns. Since low back pain is rarely dangerous, it is often a condition that people can learn to manage on their own.    Remain active. It is stressful on the back to sit or stand in one place. Do not sit, drive, or stand in one place for more than 30 minutes at a time. Take short walks on level surfaces as soon as pain allows. Try to increase the length of time you walk each day.   Do not stay in bed. Resting more than 1 or 2 days can delay your recovery.   Do not avoid exercise or work. Your body is made to move. It is not dangerous to be active, even though your back may hurt. Your back will likely heal faster if you return to being active before your pain is gone.   Pay attention to your body when you  bend and lift. Many people have less discomfort when lifting if they bend their knees, keep the load close to their  bodies, and avoid twisting. Often, the most comfortable positions are those that put less stress on your recovering back.   Find a comfortable position to sleep. Use a firm mattress and lie on your side with your knees slightly bent. If you lie on your back, put a pillow under your knees.   Only take over-the-counter or prescription medicines as directed by your caregiver. Over-the-counter medicines to reduce pain and inflammation are often the most helpful. Your caregiver may prescribe muscle relaxant drugs. These medicines help dull your pain so you can more quickly return to your normal activities and healthy exercise.   Put ice on the injured area.   Put ice in a plastic bag.   Place a towel between your skin and the bag.   Leave the ice on for 15 to 20 minutes, 3 to 4 times a day for the first 2 to 3 days. After that, ice and heat may be alternated to reduce pain and spasms.   Ask your caregiver about trying back exercises and gentle massage. This may be of some benefit.   Avoid feeling anxious or stressed. Stress increases muscle tension and can worsen back pain. It is important to recognize when you are anxious or stressed and learn ways to manage it. Exercise is a great   option.  SEEK MEDICAL CARE IF:  You have pain that is not relieved with rest or medicine.   You have pain that does not improve in 1 week.   You have new symptoms.   You are generally not feeling well.  SEEK IMMEDIATE MEDICAL CARE IF:    You have pain that radiates from your back into your legs.   You develop new bowel or bladder control problems.   You have unusual weakness or numbness in your arms or legs.   You develop nausea or vomiting.   You develop abdominal pain.   You feel faint.  Document Released: 10/17/2005 Document Revised: 04/17/2012 Document Reviewed: 03/07/2011 [employer] Patient Information 2013 [employer].   Back Exercises These exercises may help you when beginning to  rehabilitate your injury. Your symptoms may resolve with or without further involvement from your physician, physical therapist or athletic trainer. While completing these exercises, remember:    Restoring tissue flexibility helps normal motion to return to the joints. This allows healthier, less painful movement and activity.   An effective stretch should be held for at least 30 seconds.   A stretch should never be painful. You should only feel a gentle lengthening or release in the stretched tissue.  STRETCH  Extension, Prone on Elbows   Lie on your stomach on the floor, a bed will be too soft. Place your palms about shoulder width apart and at the height of your head.   Place your elbows under your shoulders. If this is too painful, stack pillows under your chest.   Allow your body to relax so that your hips drop lower and make contact more completely with the floor.   Hold this position for __________ seconds.   Slowly return to lying flat on the floor.  Repeat __________ times. Complete this exercise __________ times per day.   RANGE OF MOTION  Extension, Prone Press Ups   Lie on your stomach on the floor, a bed will be too soft. Place your palms about shoulder width apart and at the height of your head.   Keeping your back as relaxed as possible, slowly straighten your elbows while keeping your hips on the floor. You may adjust the placement of your hands to maximize your comfort. As you gain motion, your hands will come more underneath your shoulders.   Hold this position __________ seconds.   Slowly return to lying flat on the floor.  Repeat __________ times. Complete this exercise __________ times per day.   RANGE OF MOTION- Quadruped, Neutral Spine   Assume a hands and knees position on a firm surface. Keep your hands under your shoulders and your knees under your hips. You may place padding under your knees for comfort.   Drop your head and point your tail bone toward the  ground below you. This will round out your low back like an angry cat. Hold this position for __________ seconds.   Slowly lift your head and release your tail bone so that your back sags into a large arch, like an old horse.   Hold this position for __________ seconds.   Repeat this until you feel limber in your low back.   Now, find your "sweet spot." This will be the most comfortable position somewhere between the two previous positions. This is your neutral spine. Once you have found this position, tense your stomach muscles to support your low back.   Hold this position for __________ seconds.  Repeat __________ times.   Complete this exercise __________ times per day.   STRETCH  Flexion, Single Knee to Chest   Lie on a firm bed or floor with both legs extended in front of you.   Keeping one leg in contact with the floor, bring your opposite knee to your chest. Hold your leg in place by either grabbing behind your thigh or at your knee.   Pull until you feel a gentle stretch in your low back. Hold __________ seconds.   Slowly release your grasp and repeat the exercise with the opposite side.  Repeat __________ times. Complete this exercise __________ times per day.   STRETCH - Hamstrings, Standing  Stand or sit and extend your right / left leg, placing your foot on a chair or foot stool   Keeping a slight arch in your low back and your hips straight forward.   Lead with your chest and lean forward at the waist until you feel a gentle stretch in the back of your right / left knee or thigh. (When done correctly, this exercise requires leaning only a small distance.)   Hold this position for __________ seconds.  Repeat __________ times. Complete this stretch __________ times per day. STRENGTHENING  Deep Abdominals, Pelvic Tilt   Lie on a firm bed or floor. Keeping your legs in front of you, bend your knees so they are both pointed toward the ceiling and your feet are flat on the  floor.   Tense your lower abdominal muscles to press your low back into the floor. This motion will rotate your pelvis so that your tail bone is scooping upwards rather than pointing at your feet or into the floor.   With a gentle tension and even breathing, hold this position for __________ seconds.  Repeat __________ times. Complete this exercise __________ times per day.   STRENGTHENING  Abdominals, Crunches   Lie on a firm bed or floor. Keeping your legs in front of you, bend your knees so they are both pointed toward the ceiling and your feet are flat on the floor. Cross your arms over your chest.   Slightly tip your chin down without bending your neck.   Tense your abdominals and slowly lift your trunk high enough to just clear your shoulder blades. Lifting higher can put excessive stress on the low back and does not further strengthen your abdominal muscles.   Control your return to the starting position.  Repeat __________ times. Complete this exercise __________ times per day.   STRENGTHENING  Quadruped, Opposite UE/LE Lift   Assume a hands and knees position on a firm surface. Keep your hands under your shoulders and your knees under your hips. You may place padding under your knees for comfort.   Find your neutral spine and gently tense your abdominal muscles so that you can maintain this position. Your shoulders and hips should form a rectangle that is parallel with the floor and is not twisted.   Keeping your trunk steady, lift your right hand no higher than your shoulder and then your left leg no higher than your hip. Make sure you are not holding your breath. Hold this position __________ seconds.   Continuing to keep your abdominal muscles tense and your back steady, slowly return to your starting position. Repeat with the opposite arm and leg.  Repeat __________ times. Complete this exercise __________ times per day. Document Released: 11/04/2005 Document Revised:  01/09/2012 Document Reviewed: 01/29/2009 [employer] Patient Information 2013 [employer].    

## 2012-09-26 ENCOUNTER — Encounter: Payer: Self-pay | Admitting: Internal Medicine

## 2012-09-26 ENCOUNTER — Ambulatory Visit (INDEPENDENT_AMBULATORY_CARE_PROVIDER_SITE_OTHER): Payer: 59 | Admitting: Internal Medicine

## 2012-09-26 VITALS — BP 110/78 | HR 91 | Temp 98.7°F | Ht 63.0 in | Wt 144.0 lb

## 2012-09-26 DIAGNOSIS — Z23 Encounter for immunization: Secondary | ICD-10-CM

## 2012-09-26 DIAGNOSIS — Z0000 Encounter for general adult medical examination without abnormal findings: Secondary | ICD-10-CM

## 2012-09-26 DIAGNOSIS — M545 Low back pain, unspecified: Secondary | ICD-10-CM

## 2012-09-26 DIAGNOSIS — I10 Essential (primary) hypertension: Secondary | ICD-10-CM

## 2012-09-26 MED ORDER — NAPROXEN 500 MG PO TABS: 500.0000 mg | ORAL_TABLET | Freq: Two times a day (BID) | ORAL | Status: DC

## 2012-09-26 MED ORDER — FLUCONAZOLE 150 MG PO TABS: 150.0000 mg | ORAL_TABLET | Freq: Once | ORAL | Status: DC

## 2012-09-26 MED ORDER — AMLODIPINE BESYLATE 5 MG PO TABS: 5.0000 mg | ORAL_TABLET | Freq: Every day | ORAL | Status: DC

## 2012-09-26 MED ORDER — HYDROCHLOROTHIAZIDE 12.5 MG PO TABS: 12.5000 mg | ORAL_TABLET | Freq: Every day | ORAL | Status: DC | PRN

## 2012-09-26 NOTE — Patient Instructions (Signed)
It was good to see you today. We have reviewed your prior records including labs and tests today Health Maintenance reviewed - Flu shot today -all other recommended immunizations and age-appropriate screenings are up-to-date.  Stop Tribenzor - use amlodipine daily for blood pressure and HCTZ daily as needed for fluid Stop Voltaren - use naprosyn 2x/day x 1 week, then as needed - also use flexeril muscle relaxer at night x 1 week, then as needed for muscle pains Your prescription(s) have been submitted to your pharmacy. Please take as directed and contact our office if you believe you are having problem(s) with the medication(s). Please schedule followup in 6 months for blood pressure and cholesterol check, call sooner if problems. Health Maintenance, Females A healthy lifestyle and preventative care can promote health and wellness.  Maintain regular health, dental, and eye exams.   Eat a healthy diet. Foods like vegetables, fruits, whole grains, low-fat dairy products, and lean protein foods contain the nutrients you need without too many calories. Decrease your intake of foods high in solid fats, added sugars, and salt. Get information about a proper diet from your caregiver, if necessary.   Regular physical exercise is one of the most important things you can do for your health. Most adults should get at least 150 minutes of moderate-intensity exercise (any activity that increases your heart rate and causes you to sweat) each week. In addition, most adults need muscle-strengthening exercises on 2 or more days a week.     Maintain a healthy weight. The body mass index (BMI) is a screening tool to identify possible weight problems. It provides an estimate of body fat based on height and weight. Your caregiver can help determine your BMI, and can help you achieve or maintain a healthy weight. For adults 20 years and older:   A BMI below 18.5 is considered underweight.   A BMI of 18.5 to 24.9 is  normal.   A BMI of 25 to 29.9 is considered overweight.   A BMI of 30 and above is considered obese.   Maintain normal blood lipids and cholesterol by exercising and minimizing your intake of saturated fat. Eat a balanced diet with plenty of fruits and vegetables. Blood tests for lipids and cholesterol should begin at age 82 and be repeated every 5 years. If your lipid or cholesterol levels are high, you are over 50, or you are a high risk for heart disease, you may need your cholesterol levels checked more frequently. Ongoing high lipid and cholesterol levels should be treated with medicines if diet and exercise are not effective.   If you smoke, find out from your caregiver how to quit. If you do not use tobacco, do not start.   If you are pregnant, do not drink alcohol. If you are breastfeeding, be very cautious about drinking alcohol. If you are not pregnant and choose to drink alcohol, do not exceed 1 drink per day. One drink is considered to be 12 ounces (355 mL) of beer, 5 ounces (148 mL) of wine, or 1.5 ounces (44 mL) of liquor.   Avoid use of street drugs. Do not share needles with anyone. Ask for help if you need support or instructions about stopping the use of drugs.   High blood pressure causes heart disease and increases the risk of stroke. Blood pressure should be checked at least every 1 to 2 years. Ongoing high blood pressure should be treated with medicines, if weight loss and exercise are not effective.  If you are 41 to 34 years old, ask your caregiver if you should take aspirin to prevent strokes.   Diabetes screening involves taking a blood sample to check your fasting blood sugar level. This should be done once every 3 years, after age 45, if you are within normal weight and without risk factors for diabetes. Testing should be considered at a younger age or be carried out more frequently if you are overweight and have at least 1 risk factor for diabetes.   Breast cancer  screening is essential preventative care for women. You should practice "breast self-awareness." This means understanding the normal appearance and feel of your breasts and may include breast self-examination. Any changes detected, no matter how small, should be reported to a caregiver. Women in their 72s and 30s should have a clinical breast exam (CBE) by a caregiver as part of a regular health exam every 1 to 3 years. After age 64, women should have a CBE every year. Starting at age 81, women should consider having a mammogram (breast X-ray) every year. Women who have a family history of breast cancer should talk to their caregiver about genetic screening. Women at a high risk of breast cancer should talk to their caregiver about having an MRI and a mammogram every year.   The Pap test is a screening test for cervical cancer. Women should have a Pap test starting at age 79. Between ages 51 and 43, Pap tests should be repeated every 2 years. Beginning at age 45, you should have a Pap test every 3 years as long as the past 3 Pap tests have been normal. If you had a hysterectomy for a problem that was not cancer or a condition that could lead to cancer, then you no longer need Pap tests. If you are between ages 91 and 81, and you have had normal Pap tests going back 10 years, you no longer need Pap tests. If you have had past treatment for cervical cancer or a condition that could lead to cancer, you need Pap tests and screening for cancer for at least 20 years after your treatment. If Pap tests have been discontinued, risk factors (such as a new sexual partner) need to be reassessed to determine if screening should be resumed. Some women have medical problems that increase the chance of getting cervical cancer. In these cases, your caregiver may recommend more frequent screening and Pap tests.   The human papillomavirus (HPV) test is an additional test that may be used for cervical cancer screening. The HPV test  looks for the virus that can cause the cell changes on the cervix. The cells collected during the Pap test can be tested for HPV. The HPV test could be used to screen women aged 69 years and older, and should be used in women of any age who have unclear Pap test results. After the age of 72, women should have HPV testing at the same frequency as a Pap test.   Colorectal cancer can be detected and often prevented. Most routine colorectal cancer screening begins at the age of 55 and continues through age 33. However, your caregiver may recommend screening at an earlier age if you have risk factors for colon cancer. On a yearly basis, your caregiver may provide home test kits to check for hidden blood in the stool. Use of a small camera at the end of a tube, to directly examine the colon (sigmoidoscopy or colonoscopy), can detect the earliest forms of  colorectal cancer. Talk to your caregiver about this at age 33, when routine screening begins. Direct examination of the colon should be repeated every 5 to 10 years through age 69, unless early forms of pre-cancerous polyps or small growths are found.   Hepatitis C blood testing is recommended for all people born from [birth year] through 1965 and any individual with known risks for hepatitis C.   Practice safe sex. Use condoms and avoid high-risk sexual practices to reduce the spread of sexually transmitted infections (STIs). Sexually active women aged 31 and younger should be checked for Chlamydia, which is a common sexually transmitted infection. Older women with new or multiple partners should also be tested for Chlamydia. Testing for other STIs is recommended if you are sexually active and at increased risk.   Osteoporosis is a disease in which the bones lose minerals and strength with aging. This can result in serious bone fractures. The risk of osteoporosis can be identified using a bone density scan. Women ages 25 and over and women at risk for fractures or  osteoporosis should discuss screening with their caregivers. Ask your caregiver whether you should be taking a calcium supplement or vitamin D to reduce the rate of osteoporosis.   Menopause can be associated with physical symptoms and risks. Hormone replacement therapy is available to decrease symptoms and risks. You should talk to your caregiver about whether hormone replacement therapy is right for you.   Use sunscreen with a sun protection factor (SPF) of 30 or greater. Apply sunscreen liberally and repeatedly throughout the day. You should seek shade when your shadow is shorter than you. Protect yourself by wearing long sleeves, pants, a wide-brimmed hat, and sunglasses year round, whenever you are outdoors.   Notify your caregiver of new moles or changes in moles, especially if there is a change in shape or color. Also notify your caregiver if a mole is larger than the size of a pencil eraser.   Stay current with your immunizations.  Document Released: 05/02/2011 Document Revised: 01/09/2012 Document Reviewed: 05/02/2011 [facility] Patient Information 2013 [place], [place].

## 2012-09-26 NOTE — Assessment & Plan Note (Signed)
BP Readings from Last 3 Encounters:  09/26/12 110/78  09/04/12 116/82  02/20/12 110/80   Given good control, will reduce amount of med therapy: stop ARB, continue amlodipine 5 mg daily and use HCTZ prn Patient will continue to monitor and call if systolic pressure greater than 135
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
## 2013-01-01 ENCOUNTER — Telehealth: Payer: Self-pay | Admitting: Internal Medicine

## 2013-01-01 MED ORDER — FLUCONAZOLE 150 MG PO TABS: 150.0000 mg | ORAL_TABLET | Freq: Once | ORAL | Status: DC

## 2013-01-01 NOTE — Telephone Encounter (Signed)
Patient Information:  Caller Name: Claryce  Phone: [phone]  Patient: [name], [name]  Gender: Female  DOB: [birth year]  Age: 37 Years  PCP: Rene Paci (Adults only)  Pregnant: No  Office Follow Up:  Does the office need to follow up with this patient?: Yes  Instructions For The Office: Patient refused home care and refused appointment.  SHe insists on hoving [facility]an [facility] [address].  RN Note:  Caller requests [facility] [address].  Symptoms  Reason For Call & Symptoms: Caller states, "I think I have a yeast infection and she usually calls in a little pill when I do."  Reports copious creamy  to yellowish vaginal discharge.  Reviewed Health History In EMR: Yes  Reviewed Medications In EMR: Yes  Reviewed Allergies In EMR: Yes  Reviewed Surgeries / Procedures: Yes  Date of Onset of Symptoms: 12/30/2012  Treatments Tried: anti-itch cream  Treatments Tried Worked: No OB / GYN:  LMP: 12/22/2012  Guideline(s) Used:  Vaginal Discharge  Disposition Per Guideline:   Home Care  Reason For Disposition Reached:   Symptoms of a vaginal yeast infection (i.e., white, thick, cottage-cheese-like, itchy, not bad smelling discharge)  Advice Given:  Antifungal Medication for Vaginal Yeast Infection:   Available in the [place]: Femstat-3, miconazole (Monistat-3), clotrimazole (Gyne-Lotrimin-3, Mycelex-7), butoconazole (Femstat-3).  Genital Hygiene:   Keep your genital area clean. Wash daily.  Keep your genital area dry. Wear cotton underwear or underwear with a cotton crotch.  Do not douche.  Do not use feminine hygiene products.  Call Back If:  You become worse.  Patient Refused Recommendation:  Patient Requests Prescription  Diflucan requested.

## 2013-01-01 NOTE — Telephone Encounter (Signed)
Notified pt ok refill but will need ov if ongoing sxs...Raechel Chute

## 2013-01-16 ENCOUNTER — Other Ambulatory Visit: Payer: Self-pay | Admitting: Obstetrics and Gynecology

## 2013-01-16 DIAGNOSIS — N6002 Solitary cyst of left breast: Secondary | ICD-10-CM

## 2013-01-25 ENCOUNTER — Ambulatory Visit
Admission: RE | Admit: 2013-01-25 | Discharge: 2013-01-25 | Disposition: A | Discharge status: Home / Self Care | Payer: 59 | Source: Ambulatory Visit | Attending: Obstetrics and Gynecology | Admitting: Obstetrics and Gynecology

## 2013-01-25 DIAGNOSIS — N6002 Solitary cyst of left breast: Secondary | ICD-10-CM

## 2013-02-12 ENCOUNTER — Telehealth: Payer: Self-pay | Admitting: Internal Medicine

## 2013-02-12 NOTE — Telephone Encounter (Signed)
Patient Information:  Caller Name: Honi  Phone: [phone]  Patient: [name], [name]  Gender: Female  DOB: Dec 25, 1975  Age: 37 Years  PCP: Rene Paci (Adults only)  Pregnant: No  Office Follow Up:  Does the office need to follow up with this patient?: Yes  Instructions For The Office: Please follow up with patient regarding a refill of Allergy medications (Nasal spray, cough syrup and oral allergy medication).  [facility] [address]  RN Note:  Has had symptoms of allergies for past week, but cough started on 02/10/13.  Patient has tried OTC products without success.  Feels like her body is immune to Zyrtec.  She calls today requesting refills on allergy medications that have been ordered for her in the past (Cough syrup-Phenergan with codeine, nasal spray-Nasonex, and oral allergy medication-Claritin).  Triaged with care advice given.  Patient had rather have meds called in if possible but is willing to come in if needed.  [facility] [address]. Clinical profile reviewed orally-unable to get into Epic at that time. Last appointment with provider 09/26/12  Symptoms  Reason For Call & Symptoms: Allergy symptoms  Reviewed Health History In EMR: Yes  Reviewed Medications In EMR: Yes  Reviewed Allergies In EMR: Yes  Reviewed Surgeries / Procedures: Yes  Date of Onset of Symptoms: 02/05/2013  Treatments Tried: Mucinex, zyrtec  Treatments Tried Worked: No OB / GYN:  LMP: 01/28/2013  Guideline(s) Used:  Hay Fever - Nasal Allergies  Disposition Per Guideline:   See Today or Tomorrow in Office  Reason For Disposition Reached:   Moderate-Severe nasal allergy symptoms (i.e., interfere with sleep, school, or work) and taking antihistamines > 2 days  Advice Given:  Wash off Pollen Daily:  Remove pollen from the body with hair washing and a shower, especially before bedtime.  Avoiding Pollen:  Stay indoors on windy days  Keep windows closed in  home, at least in bedroom; use air conditioner  Use a high efficiency house air filter (HEPA or electrostatic)  Keep windows closed in car, turn AC on recirculate  Avoid playing with outdoor dog  For a Stuffy Nose - Use Nasal Washes:  Introduction: Saline (salt water) nasal irrigation (nasal washes) is an effective and simple home remedy for treating stuffy nose and sinus congestion. The nose can be irrigated by pouring, spraying, or squirting salt water into the nose and then letting it run back out.  Call Back If:  Symptoms are not controlled in 2 days with continuous antihistamines  You become worse  Patient Refused Recommendation:  Patient Requests Prescription  Requests previous allergies medications to be called in.

## 2013-02-13 NOTE — Telephone Encounter (Signed)
Called pt back inform her she will need office visit to be evaluated before anything can be refill. Pt states she didn't have any coughing spells last night. Will see how she does & if sxs return will call back for appt...Raechel Chute

## 2013-03-01 ENCOUNTER — Other Ambulatory Visit: Payer: Self-pay | Admitting: Internal Medicine

## 2013-03-21 ENCOUNTER — Ambulatory Visit (INDEPENDENT_AMBULATORY_CARE_PROVIDER_SITE_OTHER): Payer: 59 | Admitting: Internal Medicine

## 2013-03-21 ENCOUNTER — Encounter: Payer: Self-pay | Admitting: Internal Medicine

## 2013-03-21 VITALS — BP 102/70 | HR 86 | Temp 98.3°F

## 2013-03-21 DIAGNOSIS — J309 Allergic rhinitis, unspecified: Secondary | ICD-10-CM

## 2013-03-21 MED ORDER — METHYLPREDNISOLONE ACETATE 80 MG/ML IJ SUSP
80.0000 mg | Freq: Once | INTRAMUSCULAR | Status: AC
  Administered 2013-03-21: 80 mg via INTRAMUSCULAR

## 2013-03-21 MED ORDER — MOMETASONE FUROATE 50 MCG/ACT NA SUSP: 1.0000 | Freq: Every day | NASAL | Status: DC | PRN

## 2013-03-21 MED ORDER — LEVOCETIRIZINE DIHYDROCHLORIDE 5 MG PO TABS: 5.0000 mg | ORAL_TABLET | Freq: Every evening | ORAL | Status: DC

## 2013-03-21 NOTE — Patient Instructions (Signed)
It was good to see you today. If you develop worsening symptoms or fever, call and we can reconsider antibiotics, but it does not appear necessary to use antibiotics at this time. Steroid injection given today to control your allergy and sinus symptoms Begin Xyzal 5 mg once daily for allergy symptoms and resume nasal nose spray for the next 30 days - Your prescription(s) have been submitted to your pharmacy. Please take as directed and contact our office if you believe you are having problem(s) with the medication(s).   Allergic Rhinitis Allergic rhinitis is when the mucous membranes in the nose respond to allergens. Allergens are particles in the air that cause your body to have an allergic reaction. This causes you to release allergic antibodies. Through a chain of events, these eventually cause you to release histamine into the blood stream (hence the use of antihistamines). Although meant to be protective to the body, it is this release that causes your discomfort, such as frequent sneezing, congestion and an itchy runny nose.  CAUSES  The pollen allergens may come from grasses, trees, and weeds. This is seasonal allergic rhinitis, or "hay fever." Other allergens cause year-round allergic rhinitis (perennial allergic rhinitis) such as house dust mite allergen, pet dander and mold spores.  SYMPTOMS   Nasal stuffiness (congestion).  Runny, itchy nose with sneezing and tearing of the eyes.  There is often an itching of the mouth, eyes and ears. It cannot be cured, but it can be controlled with medications. DIAGNOSIS  If you are unable to determine the offending allergen, skin or blood testing may find it. TREATMENT   Avoid the allergen.  Medications and allergy shots (immunotherapy) can help.  Hay fever may often be treated with antihistamines in pill or nasal spray forms. Antihistamines block the effects of histamine. There are over-the-counter medicines that may help with nasal  congestion and swelling around the eyes. Check with your caregiver before taking or giving this medicine. If the treatment above does not work, there are many new medications your caregiver can prescribe. Stronger medications may be used if initial measures are ineffective. Desensitizing injections can be used if medications and avoidance fails. Desensitization is when a patient is given ongoing shots until the body becomes less sensitive to the allergen. Make sure you follow up with your caregiver if problems continue. SEEK MEDICAL CARE IF:   You develop fever (more than 100.5 F (38.1 C).  You develop a cough that does not stop easily (persistent).  You have shortness of breath.  You start wheezing.  Symptoms interfere with normal daily activities. Document Released: 07/12/2001 Document Revised: 01/09/2012 Document Reviewed: 01/21/2009 [facility] Patient Information 2014 [place], [place].

## 2013-03-21 NOTE — Assessment & Plan Note (Signed)
Chronic symptoms , worse with spring season Ineffective controlled with over-the-counter antihistamines - Claritin, Allegra, Zyrtec Resume nasal steroid and encourage compliance with same Tried different antihistamine, Xyzal daily - erx done Also control acute flare symptoms with Medrol 80 mg IM today

## 2013-03-21 NOTE — Progress Notes (Signed)
  Subjective:    HPI  complains of allergy and sinusitis symptoms  Onset >1 month ago, relapsing and worsening symptoms  associated with rhinorrhea, sneezing, sore throat, mild headache  also sinus pressure and mild-mod nasal congestion, yellow discharge No relief with OTC meds Precipitated by weather change  Past Medical History  Diagnosis Date  . ALLERGIC RHINITIS   . HYPERTENSION   . OVARIAN CYST   . Unspecified Anemia     Review of Systems Constitutional: No night sweats, no unexpected weight change Pulmonary: No pleurisy or hemoptysis Cardiovascular: No chest pain or palpitations     Objective:   Physical Exam BP 102/70  Pulse 86  Temp(Src) 98.3 F (36.8 C) (Oral)  SpO2 99% GEN: nontoxic appearing and audible head congestion HENT: NCAT, no sinus tenderness bilaterally, nares with thin clear discharge and turbinate swelling, oropharynx mild erythema and PND, no exudate Eyes: Vision grossly intact, no conjunctivitis Lungs: Clear to auscultation without rhonchi or wheeze, no increased work of breathing Cardiovascular: Regular rate and rhythm, no bilateral edema  Lab Results  Component Value Date   WBC 8.3 02/20/2012   HGB 12.4 02/20/2012   HCT 37.3 02/20/2012   PLT 237.0 02/20/2012   GLUCOSE 103* 02/20/2012   CHOL 248* 02/20/2012   TRIG 59.0 02/20/2012   HDL 95.10 02/20/2012   LDLDIRECT 133.3 02/20/2012   ALT 29 02/20/2012   AST 31 02/20/2012   NA 143 02/20/2012   K 4.0 02/20/2012   CL 108 02/20/2012   CREATININE 0.7 02/20/2012   BUN 12 02/20/2012   CO2 23 02/20/2012   TSH 0.61 02/20/2012      Assessment & Plan:  allergic sinusitis Cough, postnasal drip related to above    Explained lack of efficacy for antibiotics in noninfectious disease Steroid burst for acute flare - IM medrol Change over-the-counter antihistamine to prescription Resume nasal steroids Symptomatic care with Tylenol or Advil, decongestants, antihistamine, hydration and rest -  Saline  irrigation and salt gargle advised as needed

## 2013-04-19 ENCOUNTER — Other Ambulatory Visit: Payer: Self-pay | Admitting: Internal Medicine

## 2013-04-19 NOTE — Telephone Encounter (Signed)
Done hardcopy to robin  

## 2013-04-19 NOTE — Telephone Encounter (Signed)
Faxed hardcopy to pharmacy. 

## 2013-07-03 ENCOUNTER — Encounter: Payer: Self-pay | Admitting: Internal Medicine

## 2013-07-03 ENCOUNTER — Ambulatory Visit (INDEPENDENT_AMBULATORY_CARE_PROVIDER_SITE_OTHER): Payer: 59 | Admitting: Internal Medicine

## 2013-07-03 VITALS — BP 130/90 | HR 107 | Temp 99.8°F | Wt 150.0 lb

## 2013-07-03 DIAGNOSIS — B379 Candidiasis, unspecified: Secondary | ICD-10-CM

## 2013-07-03 DIAGNOSIS — J309 Allergic rhinitis, unspecified: Secondary | ICD-10-CM

## 2013-07-03 DIAGNOSIS — J019 Acute sinusitis, unspecified: Secondary | ICD-10-CM

## 2013-07-03 MED ORDER — FLUCONAZOLE 150 MG PO TABS: 150.0000 mg | ORAL_TABLET | Freq: Once | ORAL | Status: DC

## 2013-07-03 MED ORDER — AZITHROMYCIN 250 MG PO TABS: ORAL_TABLET | ORAL | Status: DC

## 2013-07-03 NOTE — Patient Instructions (Signed)

## 2013-07-03 NOTE — Progress Notes (Signed)
HPI  Pt presents to the clinic today with c/o nasal congestion,fever and body aches. This started 5 days ago. She does have thick green nasal congestion. She has dayquil, but did not provide her with any relief. She does have a history of allergies. She takes Xyzal and Nasonex which does provide good symptoms relief. She has not had sick contacts that she is aware of.  Review of Systems      Past Medical History  Diagnosis Date  . ALLERGIC RHINITIS   . HYPERTENSION   . OVARIAN CYST   . Unspecified Anemia     Family History  Problem Relation Age of Onset  . Hypertension Mother   . Cancer Father     Prostate  . Hypertension Sister   . Diabetes Cousin     History   Social History  . Marital Status: Married    Spouse Name: N/A    Number of Children: N/A  . Years of Education: N/A   Occupational History  . Not on file.   Social History Main Topics  . Smoking status: Never Smoker   . Smokeless tobacco: Not on file     Comment: Married, lives with spouse and 3 children ; Christiane Ha and [place].   . Alcohol Use: No  . Drug Use: No  . Sexual Activity: Not on file   Other Topics Concern  . Not on file   Social History Narrative  . No narrative on file    Allergies  Allergen Reactions  . Penicillins   . Doxycycline Itching and Rash     Constitutional: Positive headache, fatigue and fever. Denies abrupt weight changes.  HEENT:  Positive nasal congestion, sore throat. Denies eye redness, eye pain, pressure behind the eyes, facial pain, nasal congestion, ear pain, ringing in the ears, wax buildup, runny nose or bloody nose. Respiratory: Positive cough. Denies difficulty breathing or shortness of breath.  Cardiovascular: Denies chest pain, chest tightness, palpitations or swelling in the hands or feet.   No other specific complaints in a complete review of systems (except as listed in HPI above).  Objective:   BP 130/90  Pulse 107  Temp(Src) 99.8 F (37.7 C)  (Oral)  Wt 150 lb (68.04 kg)  BMI 26.58 kg/m2  SpO2 98% Wt Readings from Last 3 Encounters:  07/03/13 150 lb (68.04 kg)  09/26/12 144 lb (65.318 kg)  09/04/12 144 lb (65.318 kg)     General: Appears her stated age, well developed, well nourished in NAD. HEENT: Head: normal shape and size, tender to palpation; Eyes: sclera white, no icterus, conjunctiva pink, PERRLA and EOMs intact; Ears: Tm's gray and intact, normal light reflex; Nose: mucosa boggy and moist, septum midline; Throat/Mouth: + PND. Teeth present, mucosa erythematous and moist, no exudate noted, no lesions or ulcerations noted.  Neck: Mild cervical lymphadenopathy. Neck supple, trachea midline. No massses, lumps or thyromegaly present.  Cardiovascular: Normal rate and rhythm. S1,S2 noted.  No murmur, rubs or gallops noted. No JVD or BLE edema. No carotid bruits noted. Pulmonary/Chest: Normal effort and positive vesicular breath sounds. No respiratory distress. No wheezes, rales or ronchi noted.      Assessment & Plan:   Acute sinusitis, secondary to allergic rhinitis:  Get some rest and drink plenty of water Do salt water gargles for the sore throat eRx for Azithromax x 5 days Continue nasonex and xyzal Ibuprofen for pain/fever  RTC as needed or if symptoms persist.

## 2013-07-03 NOTE — Progress Notes (Signed)
HPI: Pt presents with complaints of sinus congestion, pressure/pain, headache, fever and body aches that started 2 days ago. Pt reports thick yellow nasal drainage accompanied for pressure and headache. Pt recently seen in May for similar issues however did not have fever, diagnosed with Allergic Sinusitis. Patient has history of allergies, uses Nasonex and takes daily Xyzal. Patient also reports white vaginal discharge and itching, similar to yeast infection.      Past Medical History  Diagnosis Date  . ALLERGIC RHINITIS   . HYPERTENSION   . OVARIAN CYST   . Unspecified Anemia     Family History  Problem Relation Age of Onset  . Hypertension Mother   . Cancer Father     Prostate  . Hypertension Sister   . Diabetes Cousin     History   Social History  . Marital Status: Married    Spouse Name: N/A    Number of Children: N/A  . Years of Education: N/A   Occupational History  . Not on file.   Social History Main Topics  . Smoking status: Never Smoker   . Smokeless tobacco: Not on file     Comment: Married, lives with spouse and 3 children ; Christiane Ha and [place].   . Alcohol Use: No  . Drug Use: No  . Sexual Activity: Not on file   Other Topics Concern  . Not on file   Social History Narrative  . No narrative on file    Allergies  Allergen Reactions  . Penicillins   . Doxycycline Itching and Rash     Constitutional: Positive headache, fatigue and fever. Denies abrupt weight changes.  HEENT:  Positive eye pain, pressure behind the eyes, facial pain,thick yellow nasal discharge, nasal congestion, sinus tenderness, left ear pain/fullness, and sore throat. Denies eye redness, ear pain, ringing in the ears, wax buildup, runny nose or bloody nose. Respiratory: . Denies difficulty breathing,cough, or shortness of breath.  Cardiovascular: Denies chest pain, chest tightness, palpitations or swelling in the hands or feet. , Gynecologic: Endorses vaginal discharge and  itching. Denies swelling or pain.  No other specific complaints in a complete review of systems (except as listed in HPI above).  Objective:    BP 130/90  Pulse 107  Temp(Src) 99.8 F (37.7 C) (Oral)  Wt 150 lb (68.04 kg)  BMI 26.58 kg/m2  SpO2 98% Wt Readings from Last 3 Encounters:  07/03/13 150 lb (68.04 kg)  09/26/12 144 lb (65.318 kg)  09/04/12 144 lb (65.318 kg)    General: Appears his stated age, well developed, well nourished in NAD. HEENT: Head: normal shape and size; Eyes: sclera white, no icterus, conjunctiva pink, PERRLA and EOMs intact; Ears: Tm's gray and intact,left ear has visible clear fluid around TM , normal light reflex; Nose: mucosa erthymetous and moist, septum midline; Throat/Mouth: + PND. Teeth present, mucosa pink and moist, no exudate noted, no lesions or ulcerations noted.  Neck: Mild submandibular lymphadenopathy. Neck supple, trachea midline. No massses, lumps or thyromegaly present.  Cardiovascular: Normal rate and rhythm. S1,S2 noted.  No murmur, rubs or gallops noted. No JVD or BLE edema. No carotid bruits noted. Pulmonary/Chest: Normal effort and positive vesicular breath sounds. No respiratory distress. No wheezes, rales or ronchi noted.      Assessment & Plan:   Acute bacterial sinusitis OTC Ibuprofen for pain and sinus inflammation  Can use a Neti Pot which can be purchased from your local drug store. Continue Flonase 2 sprays each nostril for 3  days and then as needed. Prescribed Z pak for 5 days Prescribed Diflucan 1 tablet for 1 day for vaginal symptoms Follow up for worsening symptoms  RTC as needed or if symptoms persist  Kiasia Chou S, Student-NP

## 2013-08-02 ENCOUNTER — Encounter: Payer: Self-pay | Admitting: Internal Medicine

## 2013-08-02 ENCOUNTER — Ambulatory Visit (INDEPENDENT_AMBULATORY_CARE_PROVIDER_SITE_OTHER): Payer: 59 | Admitting: Internal Medicine

## 2013-08-02 VITALS — BP 120/86 | HR 97 | Temp 97.1°F | Wt 151.8 lb

## 2013-08-02 DIAGNOSIS — B379 Candidiasis, unspecified: Secondary | ICD-10-CM

## 2013-08-02 DIAGNOSIS — N3289 Other specified disorders of bladder: Secondary | ICD-10-CM

## 2013-08-02 DIAGNOSIS — J309 Allergic rhinitis, unspecified: Secondary | ICD-10-CM

## 2013-08-02 DIAGNOSIS — R109 Unspecified abdominal pain: Secondary | ICD-10-CM

## 2013-08-02 DIAGNOSIS — R3989 Other symptoms and signs involving the genitourinary system: Secondary | ICD-10-CM

## 2013-08-02 DIAGNOSIS — R300 Dysuria: Secondary | ICD-10-CM

## 2013-08-02 LAB — POCT URINALYSIS DIPSTICK
Bilirubin, UA: NEGATIVE
Blood, UA: NEGATIVE
Glucose, UA: NEGATIVE
Ketones, UA: NEGATIVE
Nitrite, UA: NEGATIVE
Protein, UA: 30
Spec Grav, UA: 1.01
Urobilinogen, UA: NEGATIVE
pH, UA: 7.5

## 2013-08-02 MED ORDER — CIPROFLOXACIN HCL 500 MG PO TABS: 500.0000 mg | ORAL_TABLET | Freq: Two times a day (BID) | ORAL | Status: DC

## 2013-08-02 MED ORDER — FLUCONAZOLE 150 MG PO TABS: 150.0000 mg | ORAL_TABLET | Freq: Once | ORAL | Status: DC

## 2013-08-02 MED ORDER — PREDNISONE 10 MG PO TABS: ORAL_TABLET | ORAL | Status: DC

## 2013-08-02 NOTE — Addendum Note (Signed)
Addended by: Darnell Level on: 08/02/2013 01:48 PM   Modules accepted: Orders

## 2013-08-02 NOTE — Patient Instructions (Signed)
Urinary Tract Infection  Urinary tract infections (UTIs) can develop anywhere along your urinary tract. Your urinary tract is your body's drainage system for removing wastes and extra water. Your urinary tract includes two kidneys, two ureters, a bladder, and a urethra. Your kidneys are a pair of bean-shaped organs. Each kidney is about the size of your fist. They are located below your ribs, one on each side of your spine.  CAUSES  Infections are caused by microbes, which are microscopic organisms, including fungi, viruses, and bacteria. These organisms are so small that they can only be seen through a microscope. Bacteria are the microbes that most commonly cause UTIs.  SYMPTOMS   Symptoms of UTIs may vary by age and gender of the patient and by the location of the infection. Symptoms in young women typically include a frequent and intense urge to urinate and a painful, burning feeling in the bladder or urethra during urination. Older women and men are more likely to be tired, shaky, and weak and have muscle aches and abdominal pain. A fever may mean the infection is in your kidneys. Other symptoms of a kidney infection include pain in your back or sides below the ribs, nausea, and vomiting.  DIAGNOSIS  To diagnose a UTI, your caregiver will ask you about your symptoms. Your caregiver also will ask to provide a urine sample. The urine sample will be tested for bacteria and white blood cells. White blood cells are made by your body to help fight infection.  TREATMENT   Typically, UTIs can be treated with medication. Because most UTIs are caused by a bacterial infection, they usually can be treated with the use of antibiotics. The choice of antibiotic and length of treatment depend on your symptoms and the type of bacteria causing your infection.  HOME CARE INSTRUCTIONS   If you were prescribed antibiotics, take them exactly as your caregiver instructs you. Finish the medication even if you feel better after you  have only taken some of the medication.   Drink enough water and fluids to keep your urine clear or pale yellow.   Avoid caffeine, tea, and carbonated beverages. They tend to irritate your bladder.   Empty your bladder often. Avoid holding urine for long periods of time.   Empty your bladder before and after sexual intercourse.   After a bowel movement, women should cleanse from front to back. Use each tissue only once.  SEEK MEDICAL CARE IF:    You have back pain.   You develop a fever.   Your symptoms do not begin to resolve within 3 days.  SEEK IMMEDIATE MEDICAL CARE IF:    You have severe back pain or lower abdominal pain.   You develop chills.   You have nausea or vomiting.   You have continued burning or discomfort with urination.  MAKE SURE YOU:    Understand these instructions.   Will watch your condition.   Will get help right away if you are not doing well or get worse.  Document Released: 07/27/2005 Document Revised: 04/17/2012 Document Reviewed: 11/25/2011  [employer] Patient Information 2014 [employer]

## 2013-08-02 NOTE — Progress Notes (Signed)
HPI  Pt presents to the clinic today with c/o nasal congestion,fever and body aches. This started 5 days ago. She does have thick green nasal congestion. She denies fever today. She has dayquil, but did not provide her with any relief. She does have a history of allergies. She takes Xyzal and Nasonex which does provide good symptoms relief. She has not had sick contacts that she is aware of. Additionally, she c/o dysuria, bladder pressure and flank pain. This started 3 days ago. She has not had a fever or chills. She has had UTI's in the past and reports this feels the same. She denies vaginal symptoms.  Review of Systems      Past Medical History  Diagnosis Date  . ALLERGIC RHINITIS   . HYPERTENSION   . OVARIAN CYST   . Unspecified Anemia     Family History  Problem Relation Age of Onset  . Hypertension Mother   . Cancer Father     Prostate  . Hypertension Sister   . Diabetes Cousin     History   Social History  . Marital Status: Married    Spouse Name: N/A    Number of Children: N/A  . Years of Education: N/A   Occupational History  . Not on file.   Social History Main Topics  . Smoking status: Never Smoker   . Smokeless tobacco: Not on file     Comment: Married, lives with spouse and 3 children ; Christiane Ha and [place].   . Alcohol Use: No  . Drug Use: No  . Sexual Activity: Not on file   Other Topics Concern  . Not on file   Social History Narrative  . No narrative on file    Allergies  Allergen Reactions  . Penicillins   . Doxycycline Itching and Rash     Constitutional: Positive headache, fatigue. Denies fever or abrupt weight changes.  HEENT:  Positive nasal congestion, sore throat. Denies eye redness, eye pain, pressure behind the eyes, facial pain, nasal congestion, ear pain, ringing in the ears, wax buildup, runny nose or bloody nose. Respiratory: Positive cough. Denies difficulty breathing or shortness of breath.  Cardiovascular: Denies chest  pain, chest tightness, palpitations or swelling in the hands or feet.  GU: Pt reports dysuria, bladder pressure and flank pain. Denies blood in urine, vaginal discharge or odor.  No other specific complaints in a complete review of systems (except as listed in HPI above).  Objective:   BP 120/86  Pulse 97  Temp(Src) 97.1 F (36.2 C) (Oral)  Wt 151 lb 12 oz (68.833 kg)  BMI 26.89 kg/m2  SpO2 95% Wt Readings from Last 3 Encounters:  08/02/13 151 lb 12 oz (68.833 kg)  07/03/13 150 lb (68.04 kg)  09/26/12 144 lb (65.318 kg)     General: Appears her stated age, well developed, well nourished in NAD. HEENT: Head: normal shape and size; Eyes: sclera white, no icterus, conjunctiva pink, PERRLA and EOMs intact; Ears: Tm's gray and intact, normal light reflex; Nose: mucosa boggy and moist, septum midline; Throat/Mouth: + PND. Teeth present, mucosa erythematous and moist, no exudate noted, no lesions or ulcerations noted.  Neck: Neck supple, trachea midline. No massses, lumps or thyromegaly present.  Cardiovascular: Normal rate and rhythm. S1,S2 noted.  No murmur, rubs or gallops noted. No JVD or BLE edema. No carotid bruits noted. Pulmonary/Chest: Normal effort and positive vesicular breath sounds. No respiratory distress. No wheezes, rales or ronchi noted.  Abdomen: Soft, tender over  the bladder, Active bowel sounds. No distention, masses noted. No CVA tenderness.     Assessment & Plan:   Allergic Rhinitis, recurrent:  Get some rest and drink plenty of water Do salt water gargles for the sore throat eRx for pred taper Continue nasonex and xyzal  Flank pain and dysuria secondary to UTI:  Urinalysis and urine culture eRx for Cipro 500 mg BID x 3 days Can take AZO OTC if needed  RTC as needed or if symptoms persist.

## 2013-08-05 ENCOUNTER — Telehealth: Payer: Self-pay | Admitting: *Deleted

## 2013-08-05 ENCOUNTER — Other Ambulatory Visit: Payer: Self-pay | Admitting: Internal Medicine

## 2013-08-05 MED ORDER — NITROFURANTOIN MONOHYD MACRO 100 MG PO CAPS: 100.0000 mg | ORAL_CAPSULE | Freq: Two times a day (BID) | ORAL | Status: DC

## 2013-08-05 NOTE — Telephone Encounter (Signed)
Is this a new allergic reaction? It is not documented in her allergies. We will switch to Macrobid. I will call this into her pharmacy

## 2013-08-05 NOTE — Telephone Encounter (Signed)
Spoke with pt, she states yes.  On yesterday she developed hand and facial swelling.

## 2013-08-05 NOTE — Telephone Encounter (Signed)
Noted can we add this into her alergies

## 2013-08-05 NOTE — Telephone Encounter (Signed)
Pt called states the antibiotic she was prescribed on Friday, she is allergic to.  Please advise

## 2013-09-25 ENCOUNTER — Other Ambulatory Visit (INDEPENDENT_AMBULATORY_CARE_PROVIDER_SITE_OTHER): Payer: 59

## 2013-09-25 ENCOUNTER — Encounter: Payer: Self-pay | Admitting: Internal Medicine

## 2013-09-25 ENCOUNTER — Ambulatory Visit (INDEPENDENT_AMBULATORY_CARE_PROVIDER_SITE_OTHER): Payer: 59 | Admitting: Internal Medicine

## 2013-09-25 VITALS — BP 130/92 | HR 93 | Temp 98.3°F | Wt 154.5 lb

## 2013-09-25 DIAGNOSIS — Z0000 Encounter for general adult medical examination without abnormal findings: Secondary | ICD-10-CM

## 2013-09-25 DIAGNOSIS — F410 Panic disorder [episodic paroxysmal anxiety] without agoraphobia: Secondary | ICD-10-CM

## 2013-09-25 DIAGNOSIS — I10 Essential (primary) hypertension: Secondary | ICD-10-CM

## 2013-09-25 LAB — URINALYSIS, ROUTINE W REFLEX MICROSCOPIC
Bilirubin Urine: NEGATIVE
Hgb urine dipstick: NEGATIVE
Ketones, ur: NEGATIVE
Leukocytes, UA: NEGATIVE
Nitrite: NEGATIVE
RBC / HPF: NONE SEEN (ref 0–?)
Specific Gravity, Urine: 1.01 (ref 1.000–1.030)
Total Protein, Urine: NEGATIVE
Urine Glucose: NEGATIVE
Urobilinogen, UA: 0.2 (ref 0.0–1.0)
WBC, UA: NONE SEEN (ref 0–?)
pH: 7 (ref 5.0–8.0)

## 2013-09-25 LAB — CBC WITH DIFFERENTIAL/PLATELET
Basophils Absolute: 0 10*3/uL (ref 0.0–0.1)
Basophils Relative: 0.4 % (ref 0.0–3.0)
Eosinophils Absolute: 0.1 10*3/uL (ref 0.0–0.7)
Eosinophils Relative: 1.2 % (ref 0.0–5.0)
HCT: 40.2 % (ref 36.0–46.0)
Hemoglobin: 13.3 g/dL (ref 12.0–15.0)
Lymphocytes Relative: 31.3 % (ref 12.0–46.0)
Lymphs Abs: 3.3 10*3/uL (ref 0.7–4.0)
MCHC: 33.2 g/dL (ref 30.0–36.0)
MCV: 81.7 fl (ref 78.0–100.0)
Monocytes Absolute: 0.8 10*3/uL (ref 0.1–1.0)
Monocytes Relative: 7.4 % (ref 3.0–12.0)
Neutro Abs: 6.4 10*3/uL (ref 1.4–7.7)
Neutrophils Relative %: 59.7 % (ref 43.0–77.0)
Platelets: 237 10*3/uL (ref 150.0–400.0)
RBC: 4.92 Mil/uL (ref 3.87–5.11)
RDW: 14.1 % (ref 11.5–14.6)
WBC: 10.7 10*3/uL — ABNORMAL HIGH (ref 4.5–10.5)

## 2013-09-25 LAB — HEPATIC FUNCTION PANEL
ALT: 31 U/L (ref 0–35)
AST: 24 U/L (ref 0–37)
Albumin: 4.1 g/dL (ref 3.5–5.2)
Alkaline Phosphatase: 73 U/L (ref 39–117)
Bilirubin, Direct: 0.1 mg/dL (ref 0.0–0.3)
Total Bilirubin: 0.8 mg/dL (ref 0.3–1.2)
Total Protein: 7.6 g/dL (ref 6.0–8.3)

## 2013-09-25 LAB — TSH: TSH: 1.07 u[IU]/mL (ref 0.35–5.50)

## 2013-09-25 LAB — BASIC METABOLIC PANEL
BUN: 9 mg/dL (ref 6–23)
CO2: 29 mEq/L (ref 19–32)
Calcium: 9.4 mg/dL (ref 8.4–10.5)
Chloride: 100 mEq/L (ref 96–112)
Creatinine, Ser: 0.5 mg/dL (ref 0.4–1.2)
GFR: 165.27 mL/min (ref >60.00)
Glucose, Bld: 86 mg/dL (ref 70–99)
Potassium: 3.3 mEq/L — ABNORMAL LOW (ref 3.5–5.1)
Sodium: 134 mEq/L — ABNORMAL LOW (ref 135–145)

## 2013-09-25 LAB — LIPID PANEL
Cholesterol: 243 mg/dL — ABNORMAL HIGH (ref 0–200)
HDL: 73.8 mg/dL (ref >39.00)
Total CHOL/HDL Ratio: 3
Triglycerides: 49 mg/dL (ref 0.0–149.0)
VLDL: 9.8 mg/dL (ref 0.0–40.0)

## 2013-09-25 LAB — LDL CHOLESTEROL, DIRECT: Direct LDL: 149.5 mg/dL

## 2013-09-25 MED ORDER — PAROXETINE HCL 10 MG PO TABS: 10.0000 mg | ORAL_TABLET | Freq: Every day | ORAL | Status: DC

## 2013-09-25 NOTE — Patient Instructions (Addendum)
It was good to see you today.  We have reviewed your prior records including labs and tests today  Health Maintenance reviewed - all recommended immunizations and age-appropriate screenings are up-to-date. Your annual flu shot was given and/or updated today.  Test(s) ordered today. Your results will be released to MyChart (or called to you) after review, usually within 72hours after test completion. If any changes need to be made, you will be notified at that same time.  Medications reviewed and updated Start low dose generic Paxil once every day for anxiety symptoms  Your prescription(s) have been submitted to your pharmacy. Please take as directed and contact our office if you believe you are having problem(s) with the medication(s).  Resume exercise and juicing as discussed   Please schedule followup in 3-6 months to wean off medications as planned - call sooner if problems  Health Maintenance, Female A healthy lifestyle and preventative care can promote health and wellness.  Maintain regular health, dental, and eye exams.  Eat a healthy diet. Foods like vegetables, fruits, whole grains, low-fat dairy products, and lean protein foods contain the nutrients you need without too many calories. Decrease your intake of foods high in solid fats, added sugars, and salt. Get information about a proper diet from your caregiver, if necessary.  Regular physical exercise is one of the most important things you can do for your health. Most adults should get at least 150 minutes of moderate-intensity exercise (any activity that increases your heart rate and causes you to sweat) each week. In addition, most adults need muscle-strengthening exercises on 2 or more days a week.   Maintain a healthy weight. The body mass index (BMI) is a screening tool to identify possible weight problems. It provides an estimate of body fat based on height and weight. Your caregiver can help determine your BMI, and can  help you achieve or maintain a healthy weight. For adults 20 years and older:  A BMI below 18.5 is considered underweight.  A BMI of 18.5 to 24.9 is normal.  A BMI of 25 to 29.9 is considered overweight.  A BMI of 30 and above is considered obese.  Maintain normal blood lipids and cholesterol by exercising and minimizing your intake of saturated fat. Eat a balanced diet with plenty of fruits and vegetables. Blood tests for lipids and cholesterol should begin at age 19 and be repeated every 5 years. If your lipid or cholesterol levels are high, you are over 50, or you are a high risk for heart disease, you may need your cholesterol levels checked more frequently.Ongoing high lipid and cholesterol levels should be treated with medicines if diet and exercise are not effective.  If you smoke, find out from your caregiver how to quit. If you do not use tobacco, do not start.  Lung cancer screening is recommended for adults aged 41 80 years who are at high risk for developing lung cancer because of a history of smoking. Yearly low-dose computed tomography (CT) is recommended for people who have at least a 30-pack-year history of smoking and are a current smoker or have quit within the past 15 years. A pack year of smoking is smoking an average of 1 pack of cigarettes a day for 1 year (for example: 1 pack a day for 30 years or 2 packs a day for 15 years). Yearly screening should continue until the smoker has stopped smoking for at least 15 years. Yearly screening should also be stopped for people who  develop a health problem that would prevent them from having lung cancer treatment.  If you are pregnant, do not drink alcohol. If you are breastfeeding, be very cautious about drinking alcohol. If you are not pregnant and choose to drink alcohol, do not exceed 1 drink per day. One drink is considered to be 12 ounces (355 mL) of beer, 5 ounces (148 mL) of wine, or 1.5 ounces (44 mL) of liquor.  Avoid use of  street drugs. Do not share needles with anyone. Ask for help if you need support or instructions about stopping the use of drugs.  High blood pressure causes heart disease and increases the risk of stroke. Blood pressure should be checked at least every 1 to 2 years. Ongoing high blood pressure should be treated with medicines, if weight loss and exercise are not effective.  If you are 5 to 35 years old, ask your caregiver if you should take aspirin to prevent strokes.  Diabetes screening involves taking a blood sample to check your fasting blood sugar level. This should be done once every 3 years, after age 36, if you are within normal weight and without risk factors for diabetes. Testing should be considered at a younger age or be carried out more frequently if you are overweight and have at least 1 risk factor for diabetes.  Breast cancer screening is essential preventative care for women. You should practice "breast self-awareness." This means understanding the normal appearance and feel of your breasts and may include breast self-examination. Any changes detected, no matter how small, should be reported to a caregiver. Women in their 64s and 30s should have a clinical breast exam (CBE) by a caregiver as part of a regular health exam every 1 to 3 years. After age 71, women should have a CBE every year. Starting at age 73, women should consider having a mammogram (breast X-ray) every year. Women who have a family history of breast cancer should talk to their caregiver about genetic screening. Women at a high risk of breast cancer should talk to their caregiver about having an MRI and a mammogram every year.  Breast cancer gene (BRCA)-related cancer risk assessment is recommended for women who have family members with BRCA-related cancers. BRCA-related cancers include breast, ovarian, tubal, and peritoneal cancers. Having family members with these cancers may be associated with an increased risk for  harmful changes (mutations) in the breast cancer genes BRCA1 and BRCA2. Results of the assessment will determine the need for genetic counseling and BRCA1 and BRCA2 testing.  The Pap test is a screening test for cervical cancer. Women should have a Pap test starting at age 31. Between ages 53 and 81, Pap tests should be repeated every 2 years. Beginning at age 79, you should have a Pap test every 3 years as long as the past 3 Pap tests have been normal. If you had a hysterectomy for a problem that was not cancer or a condition that could lead to cancer, then you no longer need Pap tests. If you are between ages 68 and 57, and you have had normal Pap tests going back 10 years, you no longer need Pap tests. If you have had past treatment for cervical cancer or a condition that could lead to cancer, you need Pap tests and screening for cancer for at least 20 years after your treatment. If Pap tests have been discontinued, risk factors (such as a new sexual partner) need to be reassessed to determine if screening should  be resumed. Some women have medical problems that increase the chance of getting cervical cancer. In these cases, your caregiver may recommend more frequent screening and Pap tests.  The human papillomavirus (HPV) test is an additional test that may be used for cervical cancer screening. The HPV test looks for the virus that can cause the cell changes on the cervix. The cells collected during the Pap test can be tested for HPV. The HPV test could be used to screen women aged 27 years and older, and should be used in women of any age who have unclear Pap test results. After the age of 50, women should have HPV testing at the same frequency as a Pap test.  Colorectal cancer can be detected and often prevented. Most routine colorectal cancer screening begins at the age of 31 and continues through age 18. However, your caregiver may recommend screening at an earlier age if you have risk factors for  colon cancer. On a yearly basis, your caregiver may provide home test kits to check for hidden blood in the stool. Use of a small camera at the end of a tube, to directly examine the colon (sigmoidoscopy or colonoscopy), can detect the earliest forms of colorectal cancer. Talk to your caregiver about this at age 62, when routine screening begins. Direct examination of the colon should be repeated every 5 to 10 years through age 11, unless early forms of pre-cancerous polyps or small growths are found.  Hepatitis C blood testing is recommended for all people born from [birth year] through 1963 and any individual with known risks for hepatitis C.  Practice safe sex. Use condoms and avoid high-risk sexual practices to reduce the spread of sexually transmitted infections (STIs). Sexually active women aged 28 and younger should be checked for Chlamydia, which is a common sexually transmitted infection. Older women with new or multiple partners should also be tested for Chlamydia. Testing for other STIs is recommended if you are sexually active and at increased risk.  Osteoporosis is a disease in which the bones lose minerals and strength with aging. This can result in serious bone fractures. The risk of osteoporosis can be identified using a bone density scan. Women ages 33 and over and women at risk for fractures or osteoporosis should discuss screening with their caregivers. Ask your caregiver whether you should be taking a calcium supplement or vitamin D to reduce the rate of osteoporosis.  Menopause can be associated with physical symptoms and risks. Hormone replacement therapy is available to decrease symptoms and risks. You should talk to your caregiver about whether hormone replacement therapy is right for you.  Use sunscreen. Apply sunscreen liberally and repeatedly throughout the day. You should seek shade when your shadow is shorter than you. Protect yourself by wearing long sleeves, pants, a wide-brimmed  hat, and sunglasses year round, whenever you are outdoors.  Notify your caregiver of new moles or changes in moles, especially if there is a change in shape or color. Also notify your caregiver if a mole is larger than the size of a pencil eraser.  Stay current with your immunizations. Document Released: 05/02/2011 Document Revised: 02/11/2013 Document Reviewed: 05/02/2011 [facility] Patient Information 2014 [place], [place]. Anxiety and Panic Attacks Your caregiver has informed you that you are having an anxiety or panic attack. There may be many forms of this. Most of the time these attacks come suddenly and without warning. They come at any time of day, including periods of sleep, and at any time  of life. They may be strong and unexplained. Although panic attacks are very scary, they are physically harmless. Sometimes the cause of your anxiety is not known. Anxiety is a protective mechanism of the body in its fight or flight mechanism. Most of these perceived danger situations are actually nonphysical situations (such as anxiety over losing a job). CAUSES  The causes of an anxiety or panic attack are many. Panic attacks may occur in otherwise healthy people given a certain set of circumstances. There may be a genetic cause for panic attacks. Some medications may also have anxiety as a side effect. SYMPTOMS  Some of the most common feelings are:  Intense terror.  Dizziness, feeling faint.  Hot and cold flashes.  Fear of going crazy.  Feelings that nothing is real.  Sweating.  Shaking.  Chest pain or a fast heartbeat (palpitations).  Smothering, choking sensations.  Feelings of impending doom and that death is near.  Tingling of extremities, this may be from over-breathing.  Altered reality (derealization).  Being detached from yourself (depersonalization). Several symptoms can be present to make up anxiety or panic attacks. DIAGNOSIS  The evaluation by your caregiver will  depend on the type of symptoms you are experiencing. The diagnosis of anxiety or panic attack is made when no physical illness can be determined to be a cause of the symptoms. TREATMENT  Treatment to prevent anxiety and panic attacks may include:  Avoidance of circumstances that cause anxiety.  Reassurance and relaxation.  Regular exercise.  Relaxation therapies, such as yoga.  Psychotherapy with a psychiatrist or therapist.  Avoidance of caffeine, alcohol and illegal drugs.  Prescribed medication. SEEK IMMEDIATE MEDICAL CARE IF:   You experience panic attack symptoms that are different than your usual symptoms.  You have any worsening or concerning symptoms. Document Released: 10/17/2005 Document Revised: 01/09/2012 Document Reviewed: 05/31/2013 [facility] Patient Information 2014 [place], [place]. Generalized Anxiety Disorder Generalized anxiety disorder (GAD) is a mental disorder. It interferes with life functions, including relationships, work, and school. GAD is different from normal anxiety, which everyone experiences at some point in their lives in response to specific life events and activities. Normal anxiety actually helps [place] prepare for and get through these life events and activities. Normal anxiety goes away after the event or activity is over.  GAD causes anxiety that is not necessarily related to specific events or activities. It also causes excess anxiety in proportion to specific events or activities. The anxiety associated with GAD is also difficult to control. GAD can vary from mild to severe. People with severe GAD can have intense waves of anxiety with physical symptoms (panic attacks).  SYMPTOMS The anxiety and worry associated with GAD are difficult to control. This anxiety and worry are related to many life events and activities and also occur more days than not for 6 months or longer. People with GAD also have three or more of the following symptoms (one or more in  children):  Restlessness.   Fatigue.  Difficulty concentrating.   Irritability.  Muscle tension.  Difficulty sleeping or unsatisfying sleep. DIAGNOSIS GAD is diagnosed through an assessment by your caregiver. Your caregiver will ask you questions aboutyour mood,physical symptoms, and events in your life. Your caregiver may ask you about your medical history and use of alcohol or drugs, including prescription medications. Your caregiver may also do a physical exam and blood tests. Certain medical conditions and the use of certain substances can cause symptoms similar to those associated with GAD. Your caregiver  may refer you to a mental health specialist for further evaluation. TREATMENT The following therapies are usually used to treat GAD:   Medication Antidepressant medication usually is prescribed for long-term daily control. Antianxiety medications may be added in severe cases, especially when panic attacks occur.   Talk therapy (psychotherapy) Certain types of talk therapy can be helpful in treating GAD by providing support, education, and guidance. A form of talk therapy called cognitive behavioral therapy can teach you healthy ways to think about and react to daily life events and activities.  Stress managementtechniques These include yoga, meditation, and exercise and can be very helpful when they are practiced regularly. A mental health specialist can help determine which treatment is best for you. Some people see improvement with one therapy. However, other people require a combination of therapies. Document Released: 02/11/2013 Document Reviewed: 02/11/2013 [facility] Patient Information 2014 [place], [place].

## 2013-09-25 NOTE — Progress Notes (Signed)
Subjective:    Patient ID: [name], female    DOB: [birth year], 37 y.o.   MRN: [id number]  HPI patient is here today for annual physical. Patient feels well overall  Also reviewed chronic medical issues interval medical events:  Reports increased anxiety symptoms with social/family stressors since move 04/2013, financial issues and son's medical issues: hot flashes, irritability, palpitations and poor sleep - using low dose xanax day as well as qhs with fair relief   Past Medical History  Diagnosis Date  . ALLERGIC RHINITIS   . HYPERTENSION   . OVARIAN CYST   . Unspecified Anemia    Family History  Problem Relation Age of Onset  . Hypertension Mother   . Cancer Father     Prostate  . Hypertension Sister   . Diabetes Cousin    History  Substance Use Topics  . Smoking status: Never Smoker   . Smokeless tobacco: Not on file     Comment: Married, lives with spouse and 3 children ; Christiane Ha and [place].   . Alcohol Use: No    Review of Systems  Constitutional: Negative for fatigue and unexpected weight change.  Respiratory: Negative for cough, shortness of breath and wheezing.   Cardiovascular: Negative for chest pain, palpitations and leg swelling.  Gastrointestinal: Negative for nausea, abdominal pain and diarrhea.  Neurological: Negative for dizziness, weakness, light-headedness and headaches.  Psychiatric/Behavioral: Positive for sleep disturbance. Negative for behavioral problems, confusion, dysphoric mood and decreased concentration. The patient is nervous/anxious.   All other systems reviewed and are negative.        Objective:   Physical Exam BP 130/92  Pulse 93  Temp(Src) 98.3 F (36.8 C) (Oral)  Wt 154 lb 8 oz (70.081 kg)  SpO2 99% Wt Readings from Last 3 Encounters:  09/25/13 154 lb 8 oz (70.081 kg)  08/02/13 151 lb 12 oz (68.833 kg)  07/03/13 150 lb (68.04 kg)   Constitutional: She appears well-developed and well-nourished. No distress.   HENT: Head: Normocephalic and atraumatic. Ears: B TMs ok, no erythema or effusion; Nose: Nose normal. Mouth/Throat: Oropharynx is clear and moist. No oropharyngeal exudate.  Eyes: Conjunctivae and EOM are normal. Pupils are equal, round, and reactive to light. No scleral icterus.  Neck: Normal range of motion. Neck supple. No JVD present. No thyromegaly present.  Cardiovascular: Normal rate, regular rhythm and normal heart sounds.  No murmur heard. No BLE edema. Pulmonary/Chest: Effort normal and breath sounds normal. No respiratory distress. She has no wheezes.  Abdominal: Soft. Bowel sounds are normal. She exhibits no distension. There is no tenderness. no masses Musculoskeletal: Normal range of motion, no joint effusions. No gross deformities Neurological: She is alert and oriented to person, place, and time. No cranial nerve deficit. Coordination normal.  Skin: Skin is warm and dry. No rash noted. No erythema.  Psychiatric: She has a mildly anxious mood and affect. Her behavior is normal. Judgment and thought content normal.   Lab Results  Component Value Date   WBC 8.3 02/20/2012   HGB 12.4 02/20/2012   HCT 37.3 02/20/2012   PLT 237.0 02/20/2012   GLUCOSE 103* 02/20/2012   CHOL 248* 02/20/2012   TRIG 59.0 02/20/2012   HDL 95.10 02/20/2012   LDLDIRECT 133.3 02/20/2012   ALT 29 02/20/2012   AST 31 02/20/2012   NA 143 02/20/2012   K 4.0 02/20/2012   CL 108 02/20/2012   CREATININE 0.7 02/20/2012   BUN 12 02/20/2012   CO2 23 02/20/2012  TSH 0.61 02/20/2012       Assessment & Plan:  CPX/v70.0 - Patient has been counseled on age-appropriate routine health concerns for screening and prevention. These are reviewed and up-to-date. Immunizations are up-to-date or declined. Labs/ordered reviewed.  Also See problem list. Medications and labs reviewed today.

## 2013-09-25 NOTE — Assessment & Plan Note (Signed)
Classic symptoms, exacerbated by stressors Check cpx labs as planned rule out metabolic issues Discussed methods of tx including rx and nonrx options Pt agrees to low dose short term paxil -  we reviewed potential risk/benefit and possible side effects - pt understands and agrees to same  Continue low dose xanax prn follow up 3-6 mo to consider wean, sooner oif worse to alter dose/med or tx strategy as needed Support and education provided at length today

## 2013-09-25 NOTE — Progress Notes (Signed)
Pre visit review using our clinic review tool, if applicable. No additional management support is needed unless otherwise documented below in the visit note. 

## 2013-09-25 NOTE — Assessment & Plan Note (Signed)
BP Readings from Last 3 Encounters:  09/25/13 130/92  08/02/13 120/86  07/03/13 130/90   08/2012 reduced amount of med therapy: stopped ARB, continued amlodipine 5 mg daily with HCTZ prn Now exac by stressors/anxiety - tx with SSRI as above, no antiHTN tx changes recommended at this time Patient will continue to monitor and call if systolic pressure greater than 135

## 2013-09-28 ENCOUNTER — Other Ambulatory Visit: Payer: Self-pay | Admitting: Internal Medicine

## 2013-11-25 ENCOUNTER — Other Ambulatory Visit: Payer: Self-pay | Admitting: Internal Medicine

## 2014-02-13 ENCOUNTER — Ambulatory Visit (INDEPENDENT_AMBULATORY_CARE_PROVIDER_SITE_OTHER): Payer: 59 | Admitting: Internal Medicine

## 2014-02-13 ENCOUNTER — Encounter: Payer: Self-pay | Admitting: Internal Medicine

## 2014-02-13 VITALS — BP 104/72 | HR 97 | Temp 98.5°F | Wt 157.0 lb

## 2014-02-13 DIAGNOSIS — F410 Panic disorder [episodic paroxysmal anxiety] without agoraphobia: Secondary | ICD-10-CM

## 2014-02-13 DIAGNOSIS — I10 Essential (primary) hypertension: Secondary | ICD-10-CM

## 2014-02-13 DIAGNOSIS — J309 Allergic rhinitis, unspecified: Secondary | ICD-10-CM

## 2014-02-13 MED ORDER — MOMETASONE FUROATE 50 MCG/ACT NA SUSP: 1.0000 | Freq: Every day | NASAL | Status: DC | PRN

## 2014-02-13 MED ORDER — LEVOCETIRIZINE DIHYDROCHLORIDE 5 MG PO TABS: 5.0000 mg | ORAL_TABLET | Freq: Every evening | ORAL | Status: DC

## 2014-02-13 MED ORDER — ALPRAZOLAM 0.5 MG PO TABS: 0.2500 mg | ORAL_TABLET | Freq: Every evening | ORAL | Status: DC | PRN

## 2014-02-13 MED ORDER — METHYLPREDNISOLONE ACETATE 80 MG/ML IJ SUSP
80.0000 mg | Freq: Once | INTRAMUSCULAR | Status: AC
  Administered 2014-02-13: 80 mg via INTRAMUSCULAR

## 2014-02-13 NOTE — Progress Notes (Signed)
Pre visit review using our clinic review tool, if applicable. No additional management support is needed unless otherwise documented below in the visit note. 

## 2014-02-13 NOTE — Patient Instructions (Addendum)
It was good to see you today.  We have reviewed your prior records including labs and tests today  Steroid injection given today to control your allergy and sinus symptoms  Medications reviewed and updated resume Xyzal 5 mg once daily for allergy symptoms and resume nasal nose spray for the next 30 days - Your prescription(s) have been submitted to your pharmacy. Please take as directed and contact our office if you believe you are having problem(s) with the medication(s).   No other changes  Hay Fever Hay fever is an allergic reaction to particles in the air. It cannot be passed from person to person. It cannot be cured, but it can be controlled. CAUSES  Hay fever is caused by something that triggers an allergic reaction (allergens). The following are examples of allergens:  Ragweed.  Feathers.  Animal dander.  Grass and tree pollens.  Cigarette smoke.  House dust.  Pollution. SYMPTOMS   Sneezing.  Runny or stuffy nose.  Tearing eyes.  Itchy eyes, nose, mouth, throat, skin, or other area.  Sore throat.  Headache.  Decreased sense of smell or taste. DIAGNOSIS Your caregiver will perform a physical exam and ask questions about the symptoms you are having.Allergy testing may be done to determine exactly what triggers your hay fever.  TREATMENT   Over-the-counter medicines may help symptoms. These include:  Antihistamines.  Decongestants. These may help with nasal congestion.  Your caregiver may prescribe medicines if over-the-counter medicines do not work.  Some people benefit from allergy shots when other medicines are not helpful. HOME CARE INSTRUCTIONS   Avoid the allergen that is causing your symptoms, if possible.  Take all medicine as told by your caregiver. SEEK MEDICAL CARE IF:   You have severe allergy symptoms and your current medicines are not helping.  Your treatment was working at one time, but you are now experiencing symptoms.  You  have sinus congestion and pressure.  You develop a fever or headache.  You have thick nasal discharge.  You have asthma and have a worsening cough and wheezing. SEEK IMMEDIATE MEDICAL CARE IF:   You have swelling of your tongue or lips.  You have trouble breathing.  You feel lightheaded or like you are going to faint.  You have cold sweats.  You have a fever. Document Released: 10/17/2005 Document Revised: 01/09/2012 Document Reviewed: 01/12/2011 [facility] Patient Information 2014 [place].

## 2014-02-13 NOTE — Assessment & Plan Note (Signed)
Classic symptoms, exacerbated by stressors 08/2013 started low dose short term paxil - taking only prn - but feels improved Ok to continue low dose xanax prn Support and education provided today

## 2014-02-13 NOTE — Progress Notes (Signed)
[name] [id number] 24, 1977 02/13/2014  Chief Complaint  Patient presents with  . Allergic Rhinitis     Subjective  HPI  Patient presents with seasonal allergies since late march. This is a chronic issue. Going outdoors aggravates her allergies, so she tries to remain indoors. Zyrtec (generic), Claritin, and Allegra have been ineffective in the past. Tried Zyrtec (brand) and relieved sxs for a few months. Has now stopped working. In the past, Medrol injection for acute flare of allergies helped her sxs. Pertinent positives include sneezing, coughing, nasal congestion, PND, sore/itchy throat, rhinorrhea, itchy nose, pruritis, itchy scalp, itchy/ watery eyes, slight sinus pressure, sight ear fullness, headaches. Pertinent negatives include myalgias, body aches, chills, fever, productive cough, recent travel, SOB, wheezing.   Hx anxiety/panic attacks: Patient denies anxiety and depressive sxs. Has been taking Paxil and Xanax PRN and says both or working. Needs refill for Xanax. Denies disinterest in activities that bring her joy, difficulty concentrating, irritability, fatigue, generalized worry about the future.   HTN is stable on current drug regimen.   Past Medical History  Diagnosis Date  . ALLERGIC RHINITIS   . HYPERTENSION   . OVARIAN CYST   . Unspecified Anemia     Past Surgical History  Procedure Laterality Date  . No past surgeries      Family History  Problem Relation Age of Onset  . Hypertension Mother   . Cancer Father     Prostate  . Hypertension Sister   . Diabetes Cousin     History  Substance Use Topics  . Smoking status: Never Smoker   . Smokeless tobacco: Not on file     Comment: Married, lives with spouse and 3 children ; Christiane HaJonathan and LesterJasmine.   . Alcohol Use: No    Current Outpatient Prescriptions on File Prior to Visit  Medication Sig Dispense Refill  . amLODipine (NORVASC) 5 MG tablet TAKE 1 TABLET (5 MG TOTAL) BY MOUTH DAILY.  30 tablet  5  .  Carbonyl Iron (PERFECT IRON) 25 MG TABS Take 1 tablet by mouth 2 (two) times daily.        . Cyanocobalamin (VITAMIN B-12) 2500 MCG SUBL Place 1 tablet under the tongue daily.        . hydrochlorothiazide (MICROZIDE) 12.5 MG capsule TAKE 1 TABLET (12.5 MG TOTAL) BY MOUTH DAILY AS NEEDED.  30 capsule  5  . Multiple Vitamin (MULTIVITAMIN) tablet Take 1 tablet by mouth daily.        Marland Kitchen. PARoxetine (PAXIL) 10 MG tablet Take 1 tablet (10 mg total) by mouth daily.  30 tablet  5   No current facility-administered medications on file prior to visit.     Allergies: Allergies  Allergen Reactions  . Penicillins   . Doxycycline Itching and Rash    Review of Systems  Constitutional: Negative for fever and chills.  HENT: Positive for sore throat. Negative for ear pain.   Eyes: Positive for discharge.       Watery discharge.  Respiratory: Positive for cough. Negative for sputum production, shortness of breath and wheezing.   Cardiovascular: Positive for PND. Negative for chest pain.  Gastrointestinal: Negative for nausea, vomiting and abdominal pain.  Genitourinary:       No urinary changes.  Musculoskeletal: Negative for myalgias.  Skin: Positive for itching.  Neurological: Positive for headaches. Negative for weakness.       Objective   Filed Vitals:   02/13/14 0855  BP: 104/72  Pulse: 97  Temp: 98.5  F (36.9 C)  TempSrc: Oral  Weight: 157 lb (71.215 kg)  SpO2: 98%    Physical Exam  Constitutional: Well-developed well-nourished female sitting in the exam room in NAD. Is coughing and blowing nose every few minutes.  HENT: H-Sinuses tender on palpation. E- No tenderness on palpation of auricle. L cloudy TM w/o erythema or bulging; R- clear TM w/ visible bones q/o erythema or bulging. N- Eryethematous turbinates bilaterally. T- Erythematous, cobblestoning appearance in pharynx. No exudate or tonsillar hypertrophy. No lymphadenopathy. Eyes: Watery discharge BL in conjunctiva. Heart:  RRR; No murmurs, rubs, gallops. Lungs: Clear on ausculation. No wheezing, rhonchi or rales. Psychiatric: Mood stable. Judgement and thought content normal.     BP Readings from Last 3 Encounters:  02/13/14 104/72  09/25/13 130/92  08/02/13 120/86    Wt Readings from Last 3 Encounters:  02/13/14 157 lb (71.215 kg)  09/25/13 154 lb 8 oz (70.081 kg)  08/02/13 151 lb 12 oz (68.833 kg)    Lab Results  Component Value Date   WBC 10.7* 09/25/2013   HGB 13.3 09/25/2013   HCT 40.2 09/25/2013   PLT 237.0 09/25/2013   GLUCOSE 86 09/25/2013   CHOL 243* 09/25/2013   TRIG 49.0 09/25/2013   HDL 73.80 09/25/2013   LDLDIRECT 149.5 09/25/2013   ALT 31 09/25/2013   AST 24 09/25/2013   NA 134* 09/25/2013   K 3.3* 09/25/2013   CL 100 09/25/2013   CREATININE 0.5 09/25/2013   BUN 9 09/25/2013   CO2 29 09/25/2013   TSH 1.07 09/25/2013    US Breast Left  01/25/2013   *RADIOLOGY REPORT*  Clinical Data:  Palpable lump left breast  DIGITAL DIAGNOSTIC BILATERAL MAMMOGRAM WITH CAD AND LEFT BREAST ULTRASOUND:  Comparison:  None.  Findings:  ACR Breast Density Category extremely dense  CC and MLO views of bilateral breasts, spot compression left CC view are submitted.  There is a small focal asymmetry with central fat consistent with intramammary lymph node in the lateral left breast.  No suspicious abnormality is identified bilaterally.  Mammographic images were processed with CAD.  Ultrasound is performed, showing no focal abnormal cystic or solid lesion at the palpable area left breast 12 o'clock subareolar region.  IMPRESSION: Negative.  RECOMMENDATION: Routine screening mammogram at age 16.  I have discussed the findings and recommendations with the patient. Results were also provided in writing at the conclusion of the visit.  If applicable, a reminder letter will be sent to the patient regarding her next appointment.  BI-RADS CATEGORY 1:  Negative.   Original Report Authenticated By: Sherian Rein,  M.D.   Mm Digital Diagnostic Bilat  01/25/2013   *RADIOLOGY REPORT*  Clinical Data:  Palpable lump left breast  DIGITAL DIAGNOSTIC BILATERAL MAMMOGRAM WITH CAD AND LEFT BREAST ULTRASOUND:  Comparison:  None.  Findings:  ACR Breast Density Category extremely dense  CC and MLO views of bilateral breasts, spot compression left CC view are submitted.  There is a small focal asymmetry with central fat consistent with intramammary lymph node in the lateral left breast.  No suspicious abnormality is identified bilaterally.  Mammographic images were processed with CAD.  Ultrasound is performed, showing no focal abnormal cystic or solid lesion at the palpable area left breast 12 o'clock subareolar region.  IMPRESSION: Negative.  RECOMMENDATION: Routine screening mammogram at age 63.  I have discussed the findings and recommendations with the patient. Results were also provided in writing at the conclusion of the visit.  If applicable, a  reminder letter will be sent to the patient regarding her next appointment.  BI-RADS CATEGORY 1:  Negative.   Original Report Authenticated By: Sherian ReinWei-Chen Lin, M.D.       Assessment and Plan  Allergic Rhinitis: Ordered 80mg  IM Modrol injection for acute flare. Made patient aware of potential transient spike in BP and slight BLE edema due to injection that resolve shortly. Prescribed Nasonex and Xyzal for chronic allergies.  Anxiety: Refilled Xanax. Finished last prescription received in 2014 for 30 tablets recently.   HTN: BP controlled on current drug regimen. Takes amlodipine and HCTZ for HTN. No medication or dosage changes made today.      Return if symptoms worsen or fail to improve. Larwance RoteKatie G Daneen Volcy, Student-PA    I have personally reviewed this case with PA student. I also personally examined this patient. I agree with history and findings as documented above. I reviewed, discussed and approve of the assessment and plan as listed above. Newt LukesValerie A Leschber, MD

## 2014-02-13 NOTE — Progress Notes (Signed)
Subjective:    Patient ID: Derrel NipNicole Klee, female    DOB: Dec 17, 1975, 38 y.o.   MRN: [id number]  HPI  Patient is here for follow up -increase allergy symptoms  Also reviewed chronic medical issues and interval medical events  Past Medical History  Diagnosis Date  . ALLERGIC RHINITIS   . HYPERTENSION   . OVARIAN CYST   . Unspecified Anemia     Review of Systems  Constitutional: Negative for fever and fatigue.  HENT: Positive for congestion, postnasal drip, rhinorrhea, sinus pressure and sneezing. Negative for ear discharge, ear pain, nosebleeds, sore throat and tinnitus.   Respiratory: Negative for cough and shortness of breath.   Cardiovascular: Negative for chest pain and leg swelling.       Objective:   Physical Exam  BP 104/72  Pulse 97  Temp(Src) 98.5 F (36.9 C) (Oral)  Wt 157 lb (71.215 kg)  SpO2 98% Wt Readings from Last 3 Encounters:  02/13/14 157 lb (71.215 kg)  09/25/13 154 lb 8 oz (70.081 kg)  08/02/13 151 lb 12 oz (68.833 kg)    Constitutional: She appears well-developed and well-nourished. No distress.  HENT: NCAT, mild sinus tenderness to palpation - TMs clear without effusion or erythema. Nares with erythema and clear DC. Neck: Normal range of motion. No LAD. supple. No JVD present. No thyromegaly present.  Cardiovascular: Normal rate, regular rhythm and normal heart sounds.  No murmur heard. No BLE edema. Pulmonary/Chest: Effort normal and breath sounds normal. No respiratory distress. She has no wheezes.  Psychiatric: She has a normal mood and affect. Her behavior is normal. Judgment and thought content normal.   Lab Results  Component Value Date   WBC 10.7* 09/25/2013   HGB 13.3 09/25/2013   HCT 40.2 09/25/2013   PLT 237.0 09/25/2013   GLUCOSE 86 09/25/2013   CHOL 243* 09/25/2013   TRIG 49.0 09/25/2013   HDL 73.80 09/25/2013   LDLDIRECT 149.5 09/25/2013   ALT 31 09/25/2013   AST 24 09/25/2013   NA 134* 09/25/2013   K 3.3* 09/25/2013     CL 100 09/25/2013   CREATININE 0.5 09/25/2013   BUN 9 09/25/2013   CO2 29 09/25/2013   TSH 1.07 09/25/2013    [place] Breast Left  01/25/2013   *RADIOLOGY REPORT*  Clinical Data:  Palpable lump left breast  DIGITAL DIAGNOSTIC BILATERAL MAMMOGRAM WITH CAD AND LEFT BREAST ULTRASOUND:  Comparison:  None.  Findings:  ACR Breast Density Category extremely dense  CC and MLO views of bilateral breasts, spot compression left CC view are submitted.  There is a small focal asymmetry with central fat consistent with intramammary lymph node in the lateral left breast.  No suspicious abnormality is identified bilaterally.  Mammographic images were processed with CAD.  Ultrasound is performed, showing no focal abnormal cystic or solid lesion at the palpable area left breast 12 o'clock subareolar region.  IMPRESSION: Negative.  RECOMMENDATION: Routine screening mammogram at age 38.  I have discussed the findings and recommendations with the patient. Results were also provided in writing at the conclusion of the visit.  If applicable, a reminder letter will be sent to the patient regarding her next appointment.  BI-RADS CATEGORY 1:  Negative.   Original Report Authenticated By: Sherian ReinWei-Chen Lin, M.D.   Mm Digital Diagnostic Bilat  01/25/2013   *RADIOLOGY REPORT*  Clinical Data:  Palpable lump left breast  DIGITAL DIAGNOSTIC BILATERAL MAMMOGRAM WITH CAD AND LEFT BREAST ULTRASOUND:  Comparison:  None.  Findings:  ACR  Breast Density Category extremely dense  CC and MLO views of bilateral breasts, spot compression left CC view are submitted.  There is a small focal asymmetry with central fat consistent with intramammary lymph node in the lateral left breast.  No suspicious abnormality is identified bilaterally.  Mammographic images were processed with CAD.  Ultrasound is performed, showing no focal abnormal cystic or solid lesion at the palpable area left breast 12 o'clock subareolar region.  IMPRESSION: Negative.  RECOMMENDATION:  Routine screening mammogram at age 38.  I have discussed the findings and recommendations with the patient. Results were also provided in writing at the conclusion of the visit.  If applicable, a reminder letter will be sent to the patient regarding her next appointment.  BI-RADS CATEGORY 1:  Negative.   Original Report Authenticated By: Sherian ReinWei-Chen Lin, M.D.       Assessment & Plan:   Problem List Items Addressed This Visit   ALLERGIC RHINITIS - Primary     Chronic symptoms , worse with spring season Ineffective controlled with over-the-counter antihistamines - Claritin, Allegra, Zyrtec all reviewed Resume nasal steroid and encourage compliance with same Re-rx different antihistamine, Xyzal daily as did well for spring 2014 flare- erx done Also control acute flare symptoms with Medrol 80 mg IM today    HYPERTENSION, BENIGN SYSTEMIC      BP Readings from Last 3 Encounters:  02/13/14 104/72  09/25/13 130/92  08/02/13 120/86   08/2012 reduced amount of med therapy: stopped ARB continue amlodipine 5 mg daily with HCTZ prn The current medical regimen is effective;  continue present plan and medications. Patient will continue to monitor and call if systolic pressure greater than 135     Panic anxiety syndrome     Classic symptoms, exacerbated by stressors 08/2013 started low dose short term paxil - taking only prn - but feels improved Ok to continue low dose xanax prn Support and education provided today    Relevant Medications      ALPRAZolam (XANAX) tablet

## 2014-02-13 NOTE — Assessment & Plan Note (Signed)
BP Readings from Last 3 Encounters:  02/13/14 104/72  09/25/13 130/92  08/02/13 120/86   08/2012 reduced amount of med therapy: stopped ARB continue amlodipine 5 mg daily with HCTZ prn The current medical regimen is effective;  continue present plan and medications. Patient will continue to monitor and call if systolic pressure greater than 135

## 2014-02-13 NOTE — Assessment & Plan Note (Signed)
Chronic symptoms , worse with spring season Ineffective controlled with over-the-counter antihistamines - Claritin, Allegra, Zyrtec all reviewed Resume nasal steroid and encourage compliance with same Re-rx different antihistamine, Xyzal daily as did well for spring 2014 flare- erx done Also control acute flare symptoms with Medrol 80 mg IM today

## 2014-02-14 ENCOUNTER — Telehealth: Payer: Self-pay | Admitting: Internal Medicine

## 2014-02-14 NOTE — Telephone Encounter (Signed)
Relevant patient education assigned to patient using Emmi. ° °

## 2014-04-07 ENCOUNTER — Ambulatory Visit: Payer: 59 | Admitting: Internal Medicine

## 2014-04-07 DIAGNOSIS — Z0289 Encounter for other administrative examinations: Secondary | ICD-10-CM

## 2014-04-28 ENCOUNTER — Ambulatory Visit: Payer: 59 | Admitting: Internal Medicine

## 2014-06-09 ENCOUNTER — Ambulatory Visit (INDEPENDENT_AMBULATORY_CARE_PROVIDER_SITE_OTHER): Payer: 59 | Admitting: Family Medicine

## 2014-06-09 ENCOUNTER — Encounter: Payer: Self-pay | Admitting: Family Medicine

## 2014-06-09 VITALS — Ht 63.0 in | Wt 166.5 lb

## 2014-06-09 DIAGNOSIS — I10 Essential (primary) hypertension: Secondary | ICD-10-CM

## 2014-06-09 DIAGNOSIS — E663 Overweight: Secondary | ICD-10-CM

## 2014-06-09 NOTE — Patient Instructions (Addendum)
-  Blood pressure management:  - Limit sodium to 1500 mg per day.  Start tracking # of g per day.    - Increase sources of potassium:  ALL fruits and veg's are good to excellent sources.     - This means you want to include veg's in both your lunch and dinner (2 X day).  - Regular exercise helps reduce BP.   - GOALS:    1. Obtain twice as many veg's as protein or carbohydrate foods for both lunch and dinner.  2. Eat at least 3 meals and 1 snack per day.  Aim for no more than 5 hours between eating. Eat within the first hour of getting up.    3. Exercise (walk) a total of 30 minutes 5 X wk.    - Make a list of 7-10 meals that taste good, are relatively quick and easy to prepare, and that meet your nutritional needs.  Use this as a basis for shopping, so you can make one of these meals any time.  Bring your list to your follow-up appointment for review.

## 2014-06-09 NOTE — Progress Notes (Signed)
Medical Nutrition Therapy:  Appt start time: 1330 end time:  1430.  Assessment:  Primary concerns today: Weight management and BP management.  Ms. Jarold Mottoatterson would like to eventually get off of BP med's if at all possible.  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  Joni Reiningicole and her family live with her mom, and Joni Reiningicole cooks several times a week.  Joni Reiningicole does not have an appetite, and she feels she doesn't eat much, but still cannot lose weight.  She became tearful today as she spoke of feeling overwhelmed with all the responsibilities she has at this time, and the difficulty finding adequate time for self-care.    Learning Readiness: Change in progress.  Barriers to learning/adherence to lifestyle change: time constraints.  Usual eating pattern includes 1-2 meals and 0-1 snacks per day.  Is especially drawn to sweets (cake), but has usually only once a week, in addn to candy 1-2 X week.  Tends to go for sweets when emotional.   Usual physical activity includes walk outside or on TM 10-20 min 2 X wk.  Frequent foods include chx, potato 1-2 X, salad, 16 oz sweet tea 2 X wk.  Avoided foods include caffeine (choc, coffee, tea) (h/a; fatigue, shakiness).    24-hr recall: (Up at 8 AM) B ( AM)-   12 oz grape juice w/ fiber w/ greens supplement Snk (3 AM)-   Snickers ice crm bar L (4 PM)-  6 oz rib-eye, salad, dressing, 5-6 bites potato, 2 rolls, 4 tsp butter, 8 oz swt tea Snk (7 PM)-  Mr Marcos EkeGoodbar D ( PM)-  water Snk ( PM)-   Yesterday's meal was a bit heavier than usual (i.e., usually fish or chx).    Progress Towards Goal(s):  In progress.   Nutritional Diagnosis:  NB-2.1 Physical inactivity As related to time constraints.  As evidenced by exercise limited to 10-20 min 2 X wk.    Intervention:  Nutrition education.  Handouts given during visit  include:  AVS  Goals sheet  Demonstrated degree of understanding via:  Teach Back   Monitoring/Evaluation:  Dietary intake, exercise, and body weight in 6 week(s).

## 2014-06-19 ENCOUNTER — Ambulatory Visit (INDEPENDENT_AMBULATORY_CARE_PROVIDER_SITE_OTHER): Payer: 59 | Admitting: Physician Assistant

## 2014-06-19 ENCOUNTER — Encounter: Payer: Self-pay | Admitting: Physician Assistant

## 2014-06-19 VITALS — BP 118/88 | HR 60 | Temp 98.8°F | Resp 18 | Wt 168.0 lb

## 2014-06-19 DIAGNOSIS — R609 Edema, unspecified: Secondary | ICD-10-CM

## 2014-06-19 DIAGNOSIS — M546 Pain in thoracic spine: Secondary | ICD-10-CM

## 2014-06-19 DIAGNOSIS — R350 Frequency of micturition: Secondary | ICD-10-CM

## 2014-06-19 LAB — POCT URINALYSIS DIPSTICK
Bilirubin, UA: NEGATIVE
Blood, UA: NEGATIVE
Glucose, UA: NEGATIVE
Ketones, UA: NEGATIVE
Leukocytes, UA: NEGATIVE
Nitrite, UA: NEGATIVE
Protein, UA: NEGATIVE
Spec Grav, UA: 1.015
Urobilinogen, UA: 0.2
pH, UA: 6.5

## 2014-06-19 NOTE — Progress Notes (Signed)
Subjective:    Patient ID: [name], female    DOB: Sep 11, 1976, 38 y.o.   MRN: [id number]  Urinary Frequency  This is a new problem. The current episode started in the past 7 days (4 days). The problem has been gradually worsening. Quality: cramping in lower abdomen with urination. The pain is moderate. There has been no fever. There is no history of pyelonephritis. Associated symptoms include flank pain (bilateral upper/mid back. No radiation.), frequency and urgency. Pertinent negatives include no chills, discharge, hematuria, hesitancy, nausea, possible pregnancy, sweats or vomiting. Treatments tried: cranberry juice. The treatment provided no relief. There is no history of catheterization, kidney stones, recurrent UTIs, a single kidney, urinary stasis or a urological procedure.      Review of Systems  Constitutional: Negative for fever and chills.  Respiratory: Negative for shortness of breath.   Cardiovascular: Negative for chest pain.  Gastrointestinal: Positive for abdominal pain (mild, crampy, lower abdominal aching during urination.). Negative for nausea, vomiting and diarrhea.  Genitourinary: Positive for urgency, frequency and flank pain (bilateral upper/mid back. No radiation.). Negative for dysuria, hesitancy and hematuria.  Neurological: Negative for syncope and headaches.  All other systems reviewed and are negative.    Past Medical History  Diagnosis Date  . ALLERGIC RHINITIS   . HYPERTENSION   . OVARIAN CYST   . Unspecified Anemia     History   Social History  . Marital Status: Married    Spouse Name: N/A    Number of Children: N/A  . Years of Education: N/A   Occupational History  . Not on file.   Social History Main Topics  . Smoking status: Never Smoker   . Smokeless tobacco: Not on file     Comment: Married, lives with spouse and 3 children ; Christiane HaJonathan and [place].   . Alcohol Use: No  . Drug Use: No  . Sexual Activity: Not on file    Other Topics Concern  . Not on file   Social History Narrative  . No narrative on file    Past Surgical History  Procedure Laterality Date  . No past surgeries      Family History  Problem Relation Age of Onset  . Hypertension Mother   . Cancer Father     Prostate  . Hypertension Sister   . Diabetes Cousin     Allergies  Allergen Reactions  . Penicillins   . Doxycycline Itching and Rash    Current Outpatient Prescriptions on File Prior to Visit  Medication Sig Dispense Refill  . ALPRAZolam (XANAX) 0.5 MG tablet Take 0.5-1 tablets (0.25-0.5 mg total) by mouth at bedtime as needed for anxiety or sleep.  30 tablet  0  . amLODipine (NORVASC) 5 MG tablet TAKE 1 TABLET (5 MG TOTAL) BY MOUTH DAILY.  30 tablet  5  . Cyanocobalamin (VITAMIN B-12) 2500 MCG SUBL Place 1 tablet under the tongue daily.        . hydrochlorothiazide (MICROZIDE) 12.5 MG capsule TAKE 1 TABLET (12.5 MG TOTAL) BY MOUTH DAILY AS NEEDED.  30 capsule  5  . levocetirizine (XYZAL) 5 MG tablet Take 1 tablet (5 mg total) by mouth every evening.  30 tablet  5  . mometasone (NASONEX) 50 MCG/ACT nasal spray Place 1 spray into the nose daily as needed.  17 g  1  . Multiple Vitamin (MULTIVITAMIN) tablet Take 1 tablet by mouth daily.        Marland Kitchen. PARoxetine (PAXIL) 10 MG tablet Take  1 tablet (10 mg total) by mouth daily.  30 tablet  5   No current facility-administered medications on file prior to visit.    EXAM: BP 118/88  Pulse 60  Temp(Src) 98.8 F (37.1 C) (Oral)  Resp 18  Wt 168 lb (76.204 kg)  LMP 06/13/2014     Objective:   Physical Exam  Nursing note and vitals reviewed. Constitutional: She is oriented to person, place, and time. She appears well-developed and well-nourished. No distress.  HENT:  Head: Normocephalic and atraumatic.  Eyes: Conjunctivae and EOM are normal. Pupils are equal, round, and reactive to light.  Cardiovascular: Normal rate, regular rhythm and intact distal pulses.    Pulmonary/Chest: Effort normal and breath sounds normal. No respiratory distress. She exhibits no tenderness.  No CVA TTP.  Abdominal: Soft. There is no tenderness.  Musculoskeletal: Normal range of motion. She exhibits edema (mild, less than 1+ pitting ankle edema.) and tenderness (TTP of the bilat paraspinal thoracic musculature.).  Neurological: She is alert and oriented to person, place, and time.  Skin: Skin is warm and dry. She is not diaphoretic.  Psychiatric: She has a normal mood and affect. Her behavior is normal. Judgment and thought content normal.     Lab Results  Component Value Date   WBC 10.7* 09/25/2013   HGB 13.3 09/25/2013   HCT 40.2 09/25/2013   PLT 237.0 09/25/2013   GLUCOSE 86 09/25/2013   CHOL 243* 09/25/2013   TRIG 49.0 09/25/2013   HDL 73.80 09/25/2013   LDLDIRECT 149.5 09/25/2013   ALT 31 09/25/2013   AST 24 09/25/2013   NA 134* 09/25/2013   K 3.3* 09/25/2013   CL 100 09/25/2013   CREATININE 0.5 09/25/2013   BUN 9 09/25/2013   CO2 29 09/25/2013   TSH 1.07 09/25/2013        Assessment & Plan:  Latanga was seen today for urinary frequency.  Diagnoses and associated orders for this visit:  Urinary frequency Comments: UA normal. Subclinical UTI? Obtain culture. Push fluids. Watchful waiting. - POCT urinalysis dipstick; Standing - Culture, Urine - POCT urinalysis dipstick  Bilateral thoracic back pain Comments: TTP sugestive of musculoskeletal pain. Tylenol regimen and heating pad. Watchful waiting.  Edema Comments: Leg raisess daily.      Return precautions provided, and patient handout on urinary frequency, back pain, edema.  Plan to follow up as needed, or for worsening or persistent symptoms despite treatment.  Patient Instructions  We will call with the rest of your urine results when available.  Try a Tylenol regimen, extra strength tylenol (500mg  tabs) 2 tabs every 8 hours.  You can also try using a heating pad on your back  to help the pain.  Increase your water intake to try and flush your kidneys.   Elevate your legs above the level of your heart to relieve edema.   If emergency symptoms discussed during visit developed, seek medical attention immediately.  Followup as needed, or for worsening or persistent symptoms despite treatment.

## 2014-06-19 NOTE — Progress Notes (Signed)
Pre visit review using our clinic review tool, if applicable. No additional management support is needed unless otherwise documented below in the visit note. 

## 2014-06-19 NOTE — Patient Instructions (Addendum)
We will call with the rest of your urine results when available.  Try a Tylenol regimen, extra strength tylenol (500mg  tabs) 2 tabs every 8 hours.  You can also try using a heating pad on your back to help the pain.  Increase your water intake to try and flush your kidneys.   Elevate your legs above the level of your heart to relieve edema.   If emergency symptoms discussed during visit developed, seek medical attention immediately.  Followup as needed, or for worsening or persistent symptoms despite treatment.    Urinary Frequency The number of times a normal person urinates depends upon how much liquid they take in and how much liquid they are losing. If the temperature is hot and there is high humidity, then the person will sweat more and usually breathe a little more frequently. These factors decrease the amount of frequency of urination that would be considered normal. The amount you drink is easily determined, but the amount of fluid lost is sometimes more difficult to calculate.  Fluid is lost in two ways:  Sensible fluid loss is usually measured by the amount of urine that you get rid of. Losses of fluid can also occur with diarrhea.  Insensible fluid loss is more difficult to measure. It is caused by evaporation. Insensible loss of fluid occurs through breathing and sweating. It usually ranges from a little less than a quart to a little more than a quart of fluid a day. In normal temperatures and activity levels, the average person may urinate 4 to 7 times in a 24-hour period. Needing to urinate more often than that could indicate a problem. If one urinates 4 to 7 times in 24 hours and has large volumes each time, that could indicate a different problem from one who urinates 4 to 7 times a day and has small volumes. The time of urinating is also important. Most urinating should be done during the waking hours. Getting up at night to urinate frequently can indicate some  problems. CAUSES  The bladder is the organ in your lower abdomen that holds urine. Like a balloon, it swells some as it fills up. Your nerves sense this and tell you it is time to head for the bathroom. There are a number of reasons that you might feel the need to urinate more often than usual. They include:  Urinary tract infection. This is usually associated with other signs such as burning when you urinate.  In men, problems with the prostate (a walnut-size gland that is located near the tube that carries urine out of your body). There are two reasons why the prostate can cause an increased frequency of urination:  An enlarged prostate that does not let the bladder empty well. If the bladder only half empties when you urinate, then it only has half the capacity to fill before you have to urinate again.  The nerves in the bladder become more hypersensitive with an increased size of the prostate even if the bladder empties completely.  Pregnancy.  Obesity. Excess weight is more likely to cause a problem for women than for men.  Bladder stones or other bladder problems.  Caffeine.  Alcohol.  Medications. For example, drugs that help the body get rid of extra fluid (diuretics) increase urine production. Some other medicines must be taken with lots of fluids.  Muscle or nerve weakness. This might be the result of a spinal cord injury, a stroke, multiple sclerosis, or Parkinson disease.  Long-standing diabetes  can decrease the sensation of the bladder. This loss of sensation makes it harder to sense the bladder needs to be emptied. Over a period of years, the bladder is stretched out by constant overfilling. This weakens the bladder muscles so that the bladder does not empty well and has less capacity to fill with new urine.  Interstitial cystitis (also called painful bladder syndrome). This condition develops because the tissues that line the inside of the bladder are inflamed (inflammation  is the body's way of reacting to injury or infection). It causes pain and frequent urination. It occurs in women more often than in men. DIAGNOSIS   To decide what might be causing your urinary frequency, your health care provider will probably:  Ask about symptoms you have noticed.  Ask about your overall health. This will include questions about any medications you are taking.  Do a physical examination.  Order some tests. These might include:  A blood test to check for diabetes or other health issues that could be contributing to the problem.  Urine testing. This could measure the flow of urine and the pressure on the bladder.  A test of your neurological system (the brain, spinal cord, and nerves). This is the system that senses the need to urinate.  A bladder test to check whether it is emptying completely when you urinate.  Cystoscopy. This test uses a thin tube with a tiny camera on it. It offers a look inside your urethra and bladder to see if there are problems.  Imaging tests. You might be given a contrast dye and then asked to urinate. X-rays are taken to see how your bladder is working. TREATMENT  It is important for you to be evaluated to determine if the amount or frequency that you have is unusual or abnormal. If it is found to be abnormal, the cause should be determined and this can usually be found out easily. Depending upon the cause, treatment could include medication, stimulation of the nerves, or surgery. There are not too many things that you can do as an individual to change your urinary frequency. It is important that you balance the amount of fluid intake needed to compensate for your activity and the temperature. Medical problems will be diagnosed and taken care of by your physician. There is no particular bladder training such as Kegel exercises that you can do to help urinary frequency. This is an exercise that is usually recommended for people who have leaking  of urine when they laugh, cough, or sneeze. HOME CARE INSTRUCTIONS   Take any medications your health care provider prescribed or suggested. Follow the directions carefully.  Practice any lifestyle changes that are recommended. These might include:  Drinking less fluid or drinking at different times of the day. If you need to urinate often during the night, for example, you may need to stop drinking fluids early in the evening.  Cutting down on caffeine or alcohol. They both can make you need to urinate more often than normal. Caffeine is found in coffee, tea, and sodas.  Losing weight, if that is recommended.  Keep a journal or a log. You might be asked to record how much you drink and when and where you feel the need to urinate. This will also help evaluate how well the treatment provided by your physician is working. SEEK MEDICAL CARE IF:   Your need to urinate often gets worse.  You feel increased pain or irritation when you urinate.  You notice  blood in your urine.  You have questions about any medications that your health care provider recommended.  You notice blood, pus, or swelling at the site of any test or treatment procedure.  You develop a fever of more than 100.74F (38.1C). SEEK IMMEDIATE MEDICAL CARE IF:  You develop a fever of more than 102.4F (38.9C). Document Released: 08/13/2009 Document Revised: 03/03/2014 Document Reviewed: 08/13/2009 [facility] Patient Information 2015 [place], [place]. This information is not intended to replace advice given to you by your health care provider. Make sure you discuss any questions you have with your health care provider. Edema Edema is an abnormal buildup of fluids. It is more common in your legs and thighs. Painless swelling of the feet and ankles is more likely as a person ages. It also is common in looser skin, like around your eyes. HOME CARE   Keep the affected body part above the level of the heart while lying down.  Do  not sit still or stand for a long time.  Do not put anything right under your knees when you lie down.  Do not wear tight clothes on your upper legs.  Exercise your legs to help the puffiness (swelling) go down.  Wear elastic bandages or support stockings as told by your doctor.  A low-salt diet may help lessen the puffiness.  Only take medicine as told by your doctor. GET HELP IF:  Treatment is not working.  You have heart, liver, or kidney disease and notice that your skin looks puffy or shiny.  You have puffiness in your legs that does not get better when you raise your legs.  You have sudden weight gain for no reason. GET HELP RIGHT AWAY IF:   You have shortness of breath or chest pain.  You cannot breathe when you lie down.  You have pain, redness, or warmth in the areas that are puffy.  You have heart, liver, or kidney disease and get edema all of a sudden.  You have a fever and your symptoms get worse all of a sudden. MAKE SURE YOU:   Understand these instructions.  Will watch your condition.  Will get help right away if you are not doing well or get worse. Document Released: 04/04/2008 Document Revised: 10/22/2013 Document Reviewed: 08/09/2013 [facility] Patient Information 2015 [place], [place]. This information is not intended to replace advice given to you by your health care provider. Make sure you discuss any questions you have with your health care provider. Edema Edema is an abnormal buildup of fluids. It is more common in your legs and thighs. Painless swelling of the feet and ankles is more likely as a person ages. It also is common in looser skin, like around your eyes. HOME CARE   Keep the affected body part above the level of the heart while lying down.  Do not sit still or stand for a long time.  Do not put anything right under your knees when you lie down.  Do not wear tight clothes on your upper legs.  Exercise your legs to help the puffiness  (swelling) go down.  Wear elastic bandages or support stockings as told by your doctor.  A low-salt diet may help lessen the puffiness.  Only take medicine as told by your doctor. GET HELP IF:  Treatment is not working.  You have heart, liver, or kidney disease and notice that your skin looks puffy or shiny.  You have puffiness in your legs that does not get better when  you raise your legs.  You have sudden weight gain for no reason. GET HELP RIGHT AWAY IF:   You have shortness of breath or chest pain.  You cannot breathe when you lie down.  You have pain, redness, or warmth in the areas that are puffy.  You have heart, liver, or kidney disease and get edema all of a sudden.  You have a fever and your symptoms get worse all of a sudden. MAKE SURE YOU:   Understand these instructions.  Will watch your condition.  Will get help right away if you are not doing well or get worse. Document Released: 04/04/2008 Document Revised: 10/22/2013 Document Reviewed: 08/09/2013 [facility] Patient Information 2015 [place], [place]. This information is not intended to replace advice given to you by your health care provider. Make sure you discuss any questions you have with your health care provider. Back Pain, Adult Back pain is very common. The pain often gets better over time. The cause of back pain is usually not dangerous. Most people can learn to manage their back pain on their own.  HOME CARE   Stay active. Start with short walks on flat ground if you can. Try to walk farther each day.  Do not sit, drive, or stand in one place for more than 30 minutes. Do not stay in bed.  Do not avoid exercise or work. Activity can help your back heal faster.  Be careful when you bend or lift an object. Bend at your knees, keep the object close to you, and do not twist.  Sleep on a firm mattress. Lie on your side, and bend your knees. If you lie on your back, put a pillow under your knees.  Only  take medicines as told by your doctor.  Put ice on the injured area.  Put ice in a plastic bag.  Place a towel between your skin and the bag.  Leave the ice on for 15-20 minutes, 03-04 times a day for the first 2 to 3 days. After that, you can switch between ice and heat packs.  Ask your doctor about back exercises or massage.  Avoid feeling anxious or stressed. Find good ways to deal with stress, such as exercise. GET HELP RIGHT AWAY IF:   Your pain does not go away with rest or medicine.  Your pain does not go away in 1 week.  You have new problems.  You do not feel well.  The pain spreads into your legs.  You cannot control when you poop (bowel movement) or pee (urinate).  Your arms or legs feel weak or lose feeling (numbness).  You feel sick to your stomach (nauseous) or throw up (vomit).  You have belly (abdominal) pain.  You feel like you may pass out (faint). MAKE SURE YOU:   Understand these instructions.  Will watch your condition.  Will get help right away if you are not doing well or get worse. Document Released: 04/04/2008 Document Revised: 01/09/2012 Document Reviewed: 02/18/2014 [facility] Patient Information 2015 [place], [place]. This information is not intended to replace advice given to you by your health care provider. Make sure you discuss any questions you have with your health care provider.

## 2014-06-21 LAB — URINE CULTURE: Colony Count: 80000

## 2014-07-08 ENCOUNTER — Ambulatory Visit: Payer: 59 | Admitting: Internal Medicine

## 2014-07-08 DIAGNOSIS — R4689 Other symptoms and signs involving appearance and behavior: Secondary | ICD-10-CM | POA: Insufficient documentation

## 2014-07-08 DIAGNOSIS — Z0289 Encounter for other administrative examinations: Secondary | ICD-10-CM

## 2014-07-09 ENCOUNTER — Telehealth: Payer: Self-pay | Admitting: Internal Medicine

## 2014-07-09 NOTE — Telephone Encounter (Signed)
Patient no showed for acute with [facility] on 9/8.  Please advise.

## 2014-07-11 ENCOUNTER — Ambulatory Visit (INDEPENDENT_AMBULATORY_CARE_PROVIDER_SITE_OTHER): Payer: 59 | Admitting: Family

## 2014-07-11 ENCOUNTER — Encounter: Payer: Self-pay | Admitting: Family

## 2014-07-11 VITALS — BP 126/80 | HR 121 | Temp 101.4°F | Wt 168.0 lb

## 2014-07-11 DIAGNOSIS — J069 Acute upper respiratory infection, unspecified: Secondary | ICD-10-CM

## 2014-07-11 DIAGNOSIS — R509 Fever, unspecified: Secondary | ICD-10-CM

## 2014-07-11 DIAGNOSIS — J329 Chronic sinusitis, unspecified: Secondary | ICD-10-CM

## 2014-07-11 DIAGNOSIS — B9789 Other viral agents as the cause of diseases classified elsewhere: Secondary | ICD-10-CM

## 2014-07-11 MED ORDER — METHYLPREDNISOLONE 4 MG PO KIT: PACK | ORAL | Status: AC

## 2014-07-11 MED ORDER — GUAIFENESIN-CODEINE 100-10 MG/5ML PO SYRP: 5.0000 mL | ORAL_SOLUTION | Freq: Three times a day (TID) | ORAL | Status: DC | PRN

## 2014-07-11 NOTE — Progress Notes (Signed)
Subjective:    Patient ID: [name], female    DOB: [birth year], 38 y.o.   MRN: [id number]  HPI  38 year old female, nonsmoker, is in today with complaints of fatigue, sore throat, sinus pressure, bodyaches that began last night. Has taken, that is helped some as well of an antihistamine. Denies any sick contact. Has a history of allergic rhinitis. Symptoms appear to be worsening.   Review of Systems  Constitutional: Positive for fever, chills and fatigue.  HENT: Positive for congestion, postnasal drip, rhinorrhea and sinus pressure.   Respiratory: Positive for cough.   Cardiovascular: Negative.   Musculoskeletal: Negative.   Skin: Negative.   Allergic/Immunologic: Positive for environmental allergies.  Neurological: Negative.   Psychiatric/Behavioral: Negative.    Past Medical History  Diagnosis Date  . ALLERGIC RHINITIS   . HYPERTENSION   . OVARIAN CYST   . Unspecified Anemia     History   Social History  . Marital Status: Married    Spouse Name: N/A    Number of Children: N/A  . Years of Education: N/A   Occupational History  . Not on file.   Social History Main Topics  . Smoking status: Never Smoker   . Smokeless tobacco: Not on file     Comment: Married, lives with spouse and 3 children ; Christiane Ha and [place].   . Alcohol Use: No  . Drug Use: No  . Sexual Activity: Not on file   Other Topics Concern  . Not on file   Social History Narrative  . No narrative on file    Past Surgical History  Procedure Laterality Date  . No past surgeries      Family History  Problem Relation Age of Onset  . Hypertension Mother   . Cancer Father     Prostate  . Hypertension Sister   . Diabetes Cousin     Allergies  Allergen Reactions  . Penicillins   . Doxycycline Itching and Rash    Current Outpatient Prescriptions on File Prior to Visit  Medication Sig Dispense Refill  . ACZONE 5 % topical gel       . ALPRAZolam (XANAX) 0.5 MG tablet Take 0.5-1  tablets (0.25-0.5 mg total) by mouth at bedtime as needed for anxiety or sleep.  30 tablet  0  . amLODipine (NORVASC) 5 MG tablet TAKE 1 TABLET (5 MG TOTAL) BY MOUTH DAILY.  30 tablet  5  . Cyanocobalamin (VITAMIN B-12) 2500 MCG SUBL Place 1 tablet under the tongue daily.        . hydrochlorothiazide (MICROZIDE) 12.5 MG capsule TAKE 1 TABLET (12.5 MG TOTAL) BY MOUTH DAILY AS NEEDED.  30 capsule  5  . levocetirizine (XYZAL) 5 MG tablet Take 1 tablet (5 mg total) by mouth every evening.  30 tablet  5  . mometasone (NASONEX) 50 MCG/ACT nasal spray Place 1 spray into the nose daily as needed.  17 g  1  . Multiple Vitamin (MULTIVITAMIN) tablet Take 1 tablet by mouth daily.        Marland Kitchen PARoxetine (PAXIL) 10 MG tablet Take 1 tablet (10 mg total) by mouth daily.  30 tablet  5   No current facility-administered medications on file prior to visit.    LMP 08/14/2015chart    Objective:   Physical Exam  Constitutional: She is oriented to person, place, and time. She appears well-developed and well-nourished.  HENT:  Right Ear: External ear normal.  Left Ear: External ear normal.  Nose: Nose normal.  Sinus tenderness to palpation of the frontal sinus  Neck: Normal range of motion. Neck supple.  Cardiovascular: Normal rate, regular rhythm and normal heart sounds.   Pulmonary/Chest: Effort normal and breath sounds normal.  Musculoskeletal: Normal range of motion.  Neurological: She is alert and oriented to person, place, and time.  Skin: Skin is warm and dry.  Psychiatric: She has a normal mood and affect.          Assessment & Plan:  Shariyah was seen today for fever, sinusitis and generalized body aches.  Diagnoses and associated orders for this visit:  Viral URI with cough  Viral sinusitis  Fever, unspecified  Other Orders - methylPREDNISolone (MEDROL DOSEPAK) 4 MG tablet; follow package directions - guaiFENesin-codeine (CHERATUSSIN AC) 100-10 MG/5ML syrup; Take 5 mLs by mouth 3 (three)  times daily as needed.   Call the office with any questions or concerns. Recheck as scheduled and as needed.

## 2014-07-11 NOTE — Patient Instructions (Signed)
Upper Respiratory Infection, Adult An upper respiratory infection (URI) is also sometimes known as the common cold. The upper respiratory tract includes the nose, sinuses, throat, trachea, and bronchi. Bronchi are the airways leading to the lungs. Most people improve within 1 week, but symptoms can last up to 2 weeks. A residual cough may last even longer.  CAUSES Many different viruses can infect the tissues lining the upper respiratory tract. The tissues become irritated and inflamed and often become very moist. Mucus production is also common. A cold is contagious. You can easily spread the virus to others by oral contact. This includes kissing, sharing a glass, coughing, or sneezing. Touching your mouth or nose and then touching a surface, which is then touched by another person, can also spread the virus. SYMPTOMS  Symptoms typically develop 1 to 3 days after you come in contact with a cold virus. Symptoms vary from person to person. They may include:  Runny nose.  Sneezing.  Nasal congestion.  Sinus irritation.  Sore throat.  Loss of voice (laryngitis).  Cough.  Fatigue.  Muscle aches.  Loss of appetite.  Headache.  Low-grade fever. DIAGNOSIS  You might diagnose your own cold based on familiar symptoms, since most people get a cold 2 to 3 times a year. Your caregiver can confirm this based on your exam. Most importantly, your caregiver can check that your symptoms are not due to another disease such as strep throat, sinusitis, pneumonia, asthma, or epiglottitis. Blood tests, throat tests, and X-rays are not necessary to diagnose a common cold, but they may sometimes be helpful in excluding other more serious diseases. Your caregiver will decide if any further tests are required. RISKS AND COMPLICATIONS  You may be at risk for a more severe case of the common cold if you smoke cigarettes, have chronic heart disease (such as heart failure) or lung disease (such as asthma), or if  you have a weakened immune system. The very young and very old are also at risk for more serious infections. Bacterial sinusitis, middle ear infections, and bacterial pneumonia can complicate the common cold. The common cold can worsen asthma and chronic obstructive pulmonary disease (COPD). Sometimes, these complications can require emergency medical care and may be life-threatening. PREVENTION  The best way to protect against getting a cold is to practice good hygiene. Avoid oral or hand contact with people with cold symptoms. Wash your hands often if contact occurs. There is no clear evidence that vitamin C, vitamin E, echinacea, or exercise reduces the chance of developing a cold. However, it is always recommended to get plenty of rest and practice good nutrition. TREATMENT  Treatment is directed at relieving symptoms. There is no cure. Antibiotics are not effective, because the infection is caused by a virus, not by bacteria. Treatment may include:  Increased fluid intake. Sports drinks offer valuable electrolytes, sugars, and fluids.  Breathing heated mist or steam (vaporizer or shower).  Eating chicken soup or other clear broths, and maintaining good nutrition.  Getting plenty of rest.  Using gargles or lozenges for comfort.  Controlling fevers with ibuprofen or acetaminophen as directed by your caregiver.  Increasing usage of your inhaler if you have asthma. Zinc gel and zinc lozenges, taken in the first 24 hours of the common cold, can shorten the duration and lessen the severity of symptoms. Pain medicines may help with fever, muscle aches, and throat pain. A variety of non-prescription medicines are available to treat congestion and runny nose. Your caregiver   can make recommendations and may suggest nasal or lung inhalers for other symptoms.  HOME CARE INSTRUCTIONS   Only take over-the-counter or prescription medicines for pain, discomfort, or fever as directed by your  caregiver.  Use a warm mist humidifier or inhale steam from a shower to increase air moisture. This may keep secretions moist and make it easier to breathe.  Drink enough water and fluids to keep your urine clear or pale yellow.  Rest as needed.  Return to work when your temperature has returned to normal or as your caregiver advises. You may need to stay home longer to avoid infecting others. You can also use a face mask and careful hand washing to prevent spread of the virus. SEEK MEDICAL CARE IF:   After the first few days, you feel you are getting worse rather than better.  You need your caregiver's advice about medicines to control symptoms.  You develop chills, worsening shortness of breath, or brown or red sputum. These may be signs of pneumonia.  You develop yellow or brown nasal discharge or pain in the face, especially when you bend forward. These may be signs of sinusitis.  You develop a fever, swollen neck glands, pain with swallowing, or white areas in the back of your throat. These may be signs of strep throat. SEEK IMMEDIATE MEDICAL CARE IF:   You have a fever.  You develop severe or persistent headache, ear pain, sinus pain, or chest pain.  You develop wheezing, a prolonged cough, cough up blood, or have a change in your usual mucus (if you have chronic lung disease).  You develop sore muscles or a stiff neck. Document Released: 04/12/2001 Document Revised: 01/09/2012 Document Reviewed: 01/22/2014 [employer] Patient Information 2015 [employer]. This information is not intended to replace advice given to you by your health care provider. Make sure you discuss any questions you have with your health care provider.  

## 2014-07-11 NOTE — Progress Notes (Signed)
Pre visit review using our clinic review tool, if applicable. No additional management support is needed unless otherwise documented below in the visit note. 

## 2014-07-18 ENCOUNTER — Other Ambulatory Visit: Payer: Self-pay | Admitting: Internal Medicine

## 2014-07-21 ENCOUNTER — Ambulatory Visit: Payer: 59 | Admitting: Family Medicine

## 2014-07-23 ENCOUNTER — Ambulatory Visit (INDEPENDENT_AMBULATORY_CARE_PROVIDER_SITE_OTHER): Payer: 59 | Admitting: Family Medicine

## 2014-07-23 ENCOUNTER — Encounter: Payer: Self-pay | Admitting: Family Medicine

## 2014-07-23 VITALS — Ht 63.0 in | Wt 165.5 lb

## 2014-07-23 DIAGNOSIS — I10 Essential (primary) hypertension: Secondary | ICD-10-CM

## 2014-07-23 DIAGNOSIS — E663 Overweight: Secondary | ICD-10-CM

## 2014-07-23 NOTE — Patient Instructions (Addendum)
-   If you increase your amount of cardio exercise, it may be more beneficial to your blood pressure and weight.    - Record your exercise in your planner.     - Remember the 5-minute rule of exercise.   - Don't entertain the negative thoughts AND replace those thoughts with purposeful, positive thoughts.  (What do you need to do to get to the gym?)   - Write about any time you overcome inertia and are successful getting to the gym. - I highly recommend that you start using your Goals Sheet daily, and you may want to comment in your planner at the end of the week on how successful your week was.   - As you review your food record, ask yourself what contributed to the days/weeks that went poorly AND what helped you be successful.    - Takeout food:  Remember, it's not all or none.  If you bring it home, you can always add fruits and/or veg's to the meal.    - Wt check on Oct 22 at 1:15:  You will not be in the clinic schedule, so just tell the front office staff you are here for a weight check with Jeannie.

## 2014-07-23 NOTE — Progress Notes (Signed)
Medical Nutrition Therapy:  Appt start time: 1330 end time:  1430.  Assessment:  Primary concerns today: Weight management and BP management. (BMI 29.32) [name] has been getting 3 meals a day, and is now exercising:  5-10 min TM 3-4 X wk, 20-30 min of weights 3-4 X wk.  She said she feels good, and is sleeping better, usually getting 8 hrs of sleep a night.  She has been keeping a food record, which has helped her to stay on track.    Barriers to learning/adherence to lifestyle change: time constraints.  [name] also talked of difficulties getting motivated to exercise sometimes when she feels overwhelmed.  We discussed some strategies for dealing with this, and I encouraged her to let go of responsibilities where she can.  (She said her 3 children, ages 29, 49, & 78, do help around the house.)  24-hr recall suggests intake of ~1600 kcal:  (Up at 7 AM) B (9 AM)-  2 [employer] bars, 5 oz [place] yogurt, water   310 Snk ( AM)-  --- L (12:30 PM)-  Salmon salad, dressing, water      450 Snk (2 PM)-  1 cupcake         250 D (8 PM)-  1 [place] burger on bun, 2 tsp mayo, tom, ~1/2 c ff's w/ cheese & bacon 600 Snk ( PM)-  --- Typical day? No. Does not usually eat ff's or a cupcake.    Progress Towards Goal(s):  In progress.   Nutritional Diagnosis:  NB-2.1 Physical inactivity As related to time constraints.  As evidenced by exercise limited to 10-20 min 2 X wk.    Intervention:  Nutrition education.  Handouts given during visit include:  AVS  Goals sheet  Demonstrated degree of understanding via:  Teach Back   Monitoring/Evaluation:  Dietary intake, exercise, and body weight in 8 week(s). (Wt check in 4 weeks.)

## 2014-08-15 ENCOUNTER — Other Ambulatory Visit: Payer: Self-pay

## 2014-08-23 ENCOUNTER — Other Ambulatory Visit: Payer: Self-pay | Admitting: Internal Medicine

## 2014-09-18 ENCOUNTER — Ambulatory Visit: Payer: 59 | Admitting: Family Medicine

## 2014-09-28 ENCOUNTER — Other Ambulatory Visit: Payer: Self-pay | Admitting: Internal Medicine

## 2014-10-16 ENCOUNTER — Other Ambulatory Visit: Payer: Self-pay | Admitting: Orthopedic Surgery

## 2014-10-16 DIAGNOSIS — M25561 Pain in right knee: Secondary | ICD-10-CM

## 2014-10-16 DIAGNOSIS — M2560 Stiffness of unspecified joint, not elsewhere classified: Secondary | ICD-10-CM

## 2014-10-30 ENCOUNTER — Other Ambulatory Visit: Payer: 59

## 2014-12-16 ENCOUNTER — Encounter: Payer: Self-pay | Admitting: Internal Medicine

## 2015-02-04 ENCOUNTER — Encounter: Payer: Self-pay | Admitting: Internal Medicine

## 2015-02-04 ENCOUNTER — Ambulatory Visit (INDEPENDENT_AMBULATORY_CARE_PROVIDER_SITE_OTHER): Payer: 59 | Admitting: Internal Medicine

## 2015-02-04 ENCOUNTER — Other Ambulatory Visit (INDEPENDENT_AMBULATORY_CARE_PROVIDER_SITE_OTHER): Payer: 59

## 2015-02-04 VITALS — BP 126/94 | HR 80 | Temp 99.1°F | Resp 16 | Ht 63.0 in | Wt 168.0 lb

## 2015-02-04 DIAGNOSIS — J309 Allergic rhinitis, unspecified: Secondary | ICD-10-CM

## 2015-02-04 DIAGNOSIS — I10 Essential (primary) hypertension: Secondary | ICD-10-CM

## 2015-02-04 DIAGNOSIS — R350 Frequency of micturition: Secondary | ICD-10-CM

## 2015-02-04 LAB — URINALYSIS, ROUTINE W REFLEX MICROSCOPIC
Bilirubin Urine: NEGATIVE
Hgb urine dipstick: NEGATIVE
Ketones, ur: NEGATIVE
Leukocytes, UA: NEGATIVE
Nitrite: NEGATIVE
RBC / HPF: NONE SEEN (ref 0–?)
Specific Gravity, Urine: 1.01 (ref 1.000–1.030)
Total Protein, Urine: NEGATIVE
Urine Glucose: NEGATIVE
Urobilinogen, UA: 0.2 (ref 0.0–1.0)
pH: 7 (ref 5.0–8.0)

## 2015-02-04 LAB — CBC WITH DIFFERENTIAL/PLATELET
Basophils Absolute: 0 10*3/uL (ref 0.0–0.1)
Basophils Relative: 0.3 % (ref 0.0–3.0)
Eosinophils Absolute: 0.3 10*3/uL (ref 0.0–0.7)
Eosinophils Relative: 3 % (ref 0.0–5.0)
HCT: 41 % (ref 36.0–46.0)
Hemoglobin: 13.4 g/dL (ref 12.0–15.0)
Lymphocytes Relative: 25.5 % (ref 12.0–46.0)
Lymphs Abs: 2.7 10*3/uL (ref 0.7–4.0)
MCHC: 32.6 g/dL (ref 30.0–36.0)
MCV: 82.6 fl (ref 78.0–100.0)
Monocytes Absolute: 0.7 10*3/uL (ref 0.1–1.0)
Monocytes Relative: 6.8 % (ref 3.0–12.0)
Neutro Abs: 6.9 10*3/uL (ref 1.4–7.7)
Neutrophils Relative %: 64.4 % (ref 43.0–77.0)
Platelets: 299 10*3/uL (ref 150.0–400.0)
RBC: 4.97 Mil/uL (ref 3.87–5.11)
RDW: 14.6 % (ref 11.5–15.5)
WBC: 10.7 10*3/uL — ABNORMAL HIGH (ref 4.0–10.5)

## 2015-02-04 LAB — TSH: TSH: 0.82 u[IU]/mL (ref 0.35–4.50)

## 2015-02-04 LAB — BASIC METABOLIC PANEL
BUN: 8 mg/dL (ref 6–23)
CO2: 29 mEq/L (ref 19–32)
Calcium: 9.7 mg/dL (ref 8.4–10.5)
Chloride: 101 mEq/L (ref 96–112)
Creatinine, Ser: 0.75 mg/dL (ref 0.40–1.20)
GFR: 112.25 mL/min (ref >60.00)
Glucose, Bld: 110 mg/dL — ABNORMAL HIGH (ref 70–99)
Potassium: 3.6 mEq/L (ref 3.5–5.1)
Sodium: 137 mEq/L (ref 135–145)

## 2015-02-04 LAB — HCG, QUANTITATIVE, PREGNANCY: Quantitative HCG: 0.24 m[IU]/mL

## 2015-02-04 MED ORDER — HYDROCHLOROTHIAZIDE 12.5 MG PO CAPS: 12.5000 mg | ORAL_CAPSULE | Freq: Every day | ORAL | Status: DC | PRN

## 2015-02-04 MED ORDER — AMLODIPINE BESYLATE 5 MG PO TABS: 5.0000 mg | ORAL_TABLET | Freq: Every day | ORAL | Status: DC

## 2015-02-04 MED ORDER — LEVOCETIRIZINE DIHYDROCHLORIDE 5 MG PO TABS: 5.0000 mg | ORAL_TABLET | Freq: Every evening | ORAL | Status: DC

## 2015-02-04 MED ORDER — MOMETASONE FUROATE 50 MCG/ACT NA SUSP: 1.0000 | Freq: Every day | NASAL | Status: DC | PRN

## 2015-02-04 NOTE — Progress Notes (Signed)
Pre visit review using our clinic review tool, if applicable. No additional management support is needed unless otherwise documented below in the visit note. 

## 2015-02-04 NOTE — Patient Instructions (Signed)

## 2015-02-04 NOTE — Progress Notes (Signed)
Subjective:    Patient ID: [name], female    DOB: 1976/01/08, 39 y.o.   MRN: [id number]  HPI Comments: She complains of a one week history of frequent urination and aching in her lower back.  Dysuria  This is a new problem. The current episode started in the past 7 days. The problem has been unchanged. The pain is at a severity of 0/10. The patient is experiencing no pain. There has been no fever. The fever has been present for less than 1 day. She is sexually active. There is no history of pyelonephritis. Associated symptoms include frequency. Pertinent negatives include no chills, discharge, flank pain, hematuria, hesitancy, nausea, possible pregnancy, sweats, urgency or vomiting. She has tried nothing for the symptoms. There is no history of catheterization, kidney stones, recurrent UTIs, a single kidney, urinary stasis or a urological procedure.      Review of Systems  Constitutional: Negative.  Negative for fever, chills, diaphoresis, appetite change and fatigue.  HENT: Positive for congestion, postnasal drip, rhinorrhea and sneezing. Negative for dental problem, facial swelling, sinus pressure, sore throat, trouble swallowing and voice change.   Eyes: Negative.   Respiratory: Negative.  Negative for cough, choking, chest tightness, shortness of breath and stridor.   Cardiovascular: Negative.  Negative for chest pain, palpitations and leg swelling.  Gastrointestinal: Negative.  Negative for nausea, vomiting, abdominal pain, diarrhea, constipation and blood in stool.  Endocrine: Positive for polyuria. Negative for polydipsia and polyphagia.  Genitourinary: Positive for dysuria and frequency. Negative for hesitancy, urgency, hematuria and flank pain.  Musculoskeletal: Negative.  Negative for myalgias, back pain, joint swelling and arthralgias.  Skin: Negative.  Negative for rash.  Allergic/Immunologic: Negative.   Neurological: Negative.  Negative for dizziness, tremors,  weakness, light-headedness and numbness.  Hematological: Negative.  Negative for adenopathy. Does not bruise/bleed easily.  Psychiatric/Behavioral: Negative for suicidal ideas, sleep disturbance, dysphoric mood and decreased concentration. The patient is nervous/anxious.        Objective:   Physical Exam  Constitutional: She is oriented to person, place, and time. She appears well-developed and well-nourished.  Non-toxic appearance. She does not have a sickly appearance. She does not appear ill. No distress.  HENT:  Right Ear: Hearing, tympanic membrane, external ear and ear canal normal.  Left Ear: Hearing, tympanic membrane, external ear and ear canal normal.  Nose: No mucosal edema, rhinorrhea, sinus tenderness or nasal deformity. No epistaxis.  No foreign bodies. Right sinus exhibits no maxillary sinus tenderness and no frontal sinus tenderness. Left sinus exhibits no maxillary sinus tenderness and no frontal sinus tenderness.  Mouth/Throat: Oropharynx is clear and moist and mucous membranes are normal. Mucous membranes are not pale, not dry and not cyanotic. No oral lesions. No trismus in the jaw. No uvula swelling. No oropharyngeal exudate, posterior oropharyngeal edema, posterior oropharyngeal erythema or tonsillar abscesses.  Eyes: Conjunctivae are normal. Right eye exhibits no discharge. Left eye exhibits no discharge. No scleral icterus.  Neck: Normal range of motion. Neck supple. No JVD present. No tracheal deviation present. No thyromegaly present.  Cardiovascular: Normal rate, regular rhythm, normal heart sounds and intact distal pulses.  Exam reveals no gallop and no friction rub.   No murmur heard. Pulmonary/Chest: Effort normal and breath sounds normal. No stridor. No respiratory distress. She has no wheezes. She has no rales. She exhibits no tenderness.  Abdominal: Soft. Bowel sounds are normal. She exhibits no distension and no mass. There is no tenderness. There is no rebound  and  no guarding.  Musculoskeletal: Normal range of motion. She exhibits no edema or tenderness.  Lymphadenopathy:    She has no cervical adenopathy.  Neurological: She is oriented to person, place, and time.  Skin: Skin is warm and dry. No rash noted. She is not diaphoretic. No erythema. No pallor.  Psychiatric: She has a normal mood and affect. Her behavior is normal. Judgment and thought content normal.     Lab Results  Component Value Date   WBC 10.7* 09/25/2013   HGB 13.3 09/25/2013   HCT 40.2 09/25/2013   PLT 237.0 09/25/2013   GLUCOSE 86 09/25/2013   CHOL 243* 09/25/2013   TRIG 49.0 09/25/2013   HDL 73.80 09/25/2013   LDLDIRECT 149.5 09/25/2013   ALT 31 09/25/2013   AST 24 09/25/2013   NA 134* 09/25/2013   K 3.3* 09/25/2013   CL 100 09/25/2013   CREATININE 0.5 09/25/2013   BUN 9 09/25/2013   CO2 29 09/25/2013   TSH 1.07 09/25/2013       Assessment & Plan:

## 2015-02-05 ENCOUNTER — Encounter: Payer: Self-pay | Admitting: Internal Medicine

## 2015-02-05 NOTE — Assessment & Plan Note (Signed)
Her exam, labs, UA are all normal This may be related to her GAD or excessive water intake Will check urine clx to be certain there is not an infection

## 2015-02-05 NOTE — Assessment & Plan Note (Signed)
Her BP is well controlled Lytes and renal function are stable 

## 2015-02-05 NOTE — Assessment & Plan Note (Signed)
Will restart her previous meds for this

## 2015-02-07 ENCOUNTER — Other Ambulatory Visit: Payer: Self-pay | Admitting: Internal Medicine

## 2015-02-07 ENCOUNTER — Encounter: Payer: Self-pay | Admitting: Internal Medicine

## 2015-02-07 DIAGNOSIS — N342 Other urethritis: Secondary | ICD-10-CM | POA: Insufficient documentation

## 2015-02-07 MED ORDER — SULFAMETHOXAZOLE-TRIMETHOPRIM 800-160 MG PO TABS: 1.0000 | ORAL_TABLET | Freq: Two times a day (BID) | ORAL | Status: AC

## 2015-02-08 LAB — CULTURE, URINE COMPREHENSIVE: Colony Count: 75000

## 2015-04-07 ENCOUNTER — Other Ambulatory Visit: Payer: Self-pay

## 2015-04-07 ENCOUNTER — Telehealth: Payer: Self-pay

## 2015-04-07 DIAGNOSIS — I10 Essential (primary) hypertension: Secondary | ICD-10-CM

## 2015-04-07 MED ORDER — ALPRAZOLAM 0.5 MG PO TABS: 0.2500 mg | ORAL_TABLET | Freq: Every evening | ORAL | Status: DC | PRN

## 2015-04-07 MED ORDER — AMLODIPINE BESYLATE 5 MG PO TABS
5.0000 mg | ORAL_TABLET | Freq: Every day | ORAL | Status: DC
Start: 2015-04-07 — End: 2016-06-10

## 2015-04-07 NOTE — Telephone Encounter (Signed)
Faxed refill request sent for  ALPRAZolam Prudy Feeler(XANAX) 0.5 MG tablet 30 tablet 5 09/29/2014     Sig - Route: Take 0.5-1 tablets (0.25-0.5 mg total) by mouth at bedtime as needed for anxiety or sleep. - Oral

## 2015-04-07 NOTE — Addendum Note (Signed)
Addended by: Tonye BecketAIRRIKIER, Amarylis Rovito M on: 04/07/2015 01:37 PM   Modules accepted: Orders

## 2015-04-08 ENCOUNTER — Other Ambulatory Visit: Payer: Self-pay

## 2015-07-22 ENCOUNTER — Other Ambulatory Visit: Payer: Self-pay

## 2015-07-22 DIAGNOSIS — I10 Essential (primary) hypertension: Secondary | ICD-10-CM

## 2015-07-22 MED ORDER — HYDROCHLOROTHIAZIDE 12.5 MG PO CAPS
12.5000 mg | ORAL_CAPSULE | Freq: Every day | ORAL | Status: DC | PRN
Start: 2015-07-22 — End: 2016-06-10

## 2015-07-22 NOTE — Telephone Encounter (Signed)
Ok to rf? 

## 2016-06-10 ENCOUNTER — Other Ambulatory Visit: Payer: 59

## 2016-06-10 ENCOUNTER — Ambulatory Visit (INDEPENDENT_AMBULATORY_CARE_PROVIDER_SITE_OTHER): Payer: 59 | Admitting: Family

## 2016-06-10 ENCOUNTER — Encounter: Payer: Self-pay | Admitting: Family

## 2016-06-10 VITALS — BP 142/94 | HR 93 | Temp 98.5°F | Resp 16 | Ht 63.0 in | Wt 162.0 lb

## 2016-06-10 DIAGNOSIS — R350 Frequency of micturition: Secondary | ICD-10-CM | POA: Diagnosis not present

## 2016-06-10 LAB — POCT URINALYSIS DIPSTICK
Bilirubin, UA: NEGATIVE
Blood, UA: NEGATIVE
Glucose, UA: NEGATIVE
Ketones, UA: NEGATIVE
Leukocytes, UA: NEGATIVE
Nitrite, UA: NEGATIVE
Protein, UA: 15
Spec Grav, UA: >=1.03
Urobilinogen, UA: NEGATIVE
pH, UA: 6

## 2016-06-10 MED ORDER — FLUCONAZOLE 150 MG PO TABS: 150.0000 mg | ORAL_TABLET | Freq: Once | ORAL | 0 refills | Status: AC

## 2016-06-10 MED ORDER — SULFAMETHOXAZOLE-TRIMETHOPRIM 800-160 MG PO TABS: 1.0000 | ORAL_TABLET | Freq: Two times a day (BID) | ORAL | 0 refills | Status: DC

## 2016-06-10 NOTE — Patient Instructions (Addendum)
Thank you for choosing [facility].  Summary/Instructions:  Your prescription(s) have been submitted to your pharmacy or been printed and provided for you. Please take as directed and contact our office if you believe you are having problem(s) with the medication(s) or have any questions.  If your symptoms worsen or fail to improve, please contact our office for further instruction, or in case of emergency go directly to the emergency room at the closest medical facility.   Urinary Tract Infection Urinary tract infections (UTIs) can develop anywhere along your urinary tract. Your urinary tract is your body's drainage system for removing wastes and extra water. Your urinary tract includes two kidneys, two ureters, a bladder, and a urethra. Your kidneys are a pair of bean-shaped organs. Each kidney is about the size of your fist. They are located below your ribs, one on each side of your spine. CAUSES Infections are caused by microbes, which are microscopic organisms, including fungi, viruses, and bacteria. These organisms are so small that they can only be seen through a microscope. Bacteria are the microbes that most commonly cause UTIs. SYMPTOMS  Symptoms of UTIs may vary by age and gender of the patient and by the location of the infection. Symptoms in young women typically include a frequent and intense urge to urinate and a painful, burning feeling in the bladder or urethra during urination. Older women and men are more likely to be tired, shaky, and weak and have muscle aches and abdominal pain. A fever may mean the infection is in your kidneys. Other symptoms of a kidney infection include pain in your back or sides below the ribs, nausea, and vomiting. DIAGNOSIS To diagnose a UTI, your caregiver will ask you about your symptoms. Your caregiver will also ask you to provide a urine sample. The urine sample will be tested for bacteria and white blood cells. White blood cells are made by your  body to help fight infection. TREATMENT  Typically, UTIs can be treated with medication. Because most UTIs are caused by a bacterial infection, they usually can be treated with the use of antibiotics. The choice of antibiotic and length of treatment depend on your symptoms and the type of bacteria causing your infection. HOME CARE INSTRUCTIONS  If you were prescribed antibiotics, take them exactly as your caregiver instructs you. Finish the medication even if you feel better after you have only taken some of the medication.  Drink enough water and fluids to keep your urine clear or pale yellow.  Avoid caffeine, tea, and carbonated beverages. They tend to irritate your bladder.  Empty your bladder often. Avoid holding urine for long periods of time.  Empty your bladder before and after sexual intercourse.  After a bowel movement, women should cleanse from front to back. Use each tissue only once. SEEK MEDICAL CARE IF:   You have back pain.  You develop a fever.  Your symptoms do not begin to resolve within 3 days. SEEK IMMEDIATE MEDICAL CARE IF:   You have severe back pain or lower abdominal pain.  You develop chills.  You have nausea or vomiting.  You have continued burning or discomfort with urination. MAKE SURE YOU:   Understand these instructions.  Will watch your condition.  Will get help right away if you are not doing well or get worse.   This information is not intended to replace advice given to you by your health care provider. Make sure you discuss any questions you have with your health care   provider.   Document Released: 07/27/2005 Document Revised: 07/08/2015 Document Reviewed: 11/25/2011 Elsevier Interactive Patient Education 2016 Elsevier Inc.  

## 2016-06-10 NOTE — Progress Notes (Signed)
Subjective:    Patient ID: Sandra NipNicole Larsh, female    DOB: 1975/11/23, 40 y.o.   MRN: [id number]  Chief Complaint  Patient presents with  . Back Pain    lower back pain, lower stomach cramping, urinary frequency, not feeling well    HPI:  [name] is a 40 y.o. female who  has a past medical history of ALLERGIC RHINITIS; HYPERTENSION; OVARIAN CYST; and Unspecified Anemia. and presents today for an acute office visit.   This is a new problem. Associated symptoms of lower stomach cramping, urinary frequency, low back pain and overall malaise has been going on for about 2 days. Denies any vaginal discharge. No fevers. No recent antibiotic usage. Symptoms have stayed about the same since initial onset.    Allergies  Allergen Reactions  . Penicillins   . Doxycycline Itching and Rash     No current outpatient prescriptions on file prior to visit.   No current facility-administered medications on file prior to visit.     Review of Systems  Constitutional: Negative for chills and fever.  Genitourinary: Positive for flank pain and frequency. Negative for dysuria, hematuria and urgency.      Objective:    BP (!) 142/94 (BP Location: Left Arm, Patient Position: Sitting, Cuff Size: Normal)   Pulse 93   Temp 98.5 F (36.9 C) (Oral)   Resp 16   Ht 5\' 3"  (1.6 m)   Wt 162 lb (73.5 kg)   SpO2 99%   BMI 28.70 kg/m  Nursing note and vital signs reviewed.  Physical Exam  Constitutional: She is oriented to person, place, and time. She appears well-developed and well-nourished. No distress.  Cardiovascular: Normal rate, regular rhythm, normal heart sounds and intact distal pulses.   Pulmonary/Chest: Effort normal and breath sounds normal.  Abdominal: There is no CVA tenderness.  Neurological: She is alert and oriented to person, place, and time.  Skin: Skin is warm and dry.  Psychiatric: She has a normal mood and affect. Her behavior is normal. Judgment and thought content  normal.      Assessment & Plan:   Problem List Items Addressed This Visit      Other   Urine frequency - Primary    Symptoms and exam consistent with urinary tract infection. In office urinalysis is negative for leukocytes, nitrites, and hematuria which patient indicates is normal for her urinary tract infections. Urine will be sent for culture. Start sulfamethoxazole trimethoprim. Start fluconazole as needed for post antibiotic candidiasis. Follow-up if symptoms worsen or fail to improve.      Relevant Medications   sulfamethoxazole-trimethoprim (BACTRIM DS,SEPTRA DS) 800-160 MG tablet   fluconazole (DIFLUCAN) 150 MG tablet   Other Relevant Orders   POCT urinalysis dipstick (Completed)   Urine culture    Other Visit Diagnoses   None.      I have discontinued Ms. Arntz's multivitamin, Vitamin B-12, ACZONE, guaiFENesin-codeine, levocetirizine, mometasone, amLODipine, ALPRAZolam, and hydrochlorothiazide. I am also having her start on sulfamethoxazole-trimethoprim and fluconazole.   Meds ordered this encounter  Medications  . sulfamethoxazole-trimethoprim (BACTRIM DS,SEPTRA DS) 800-160 MG tablet    Sig: Take 1 tablet by mouth 2 (two) times daily.    Dispense:  10 tablet    Refill:  0    Order Specific Question:   Supervising Provider    Answer:   Hillard DankerRAWFORD, ELIZABETH A [[id number]]  . fluconazole (DIFLUCAN) 150 MG tablet    Sig: Take 1 tablet (150 mg total) by mouth once. May  repeat in 48 hours.    Dispense:  2 tablet    Refill:  0    Order Specific Question:   Supervising Provider    Answer:   Hillard Danker A [[id number]]     Follow-up: Return if symptoms worsen or fail to improve.  Jeanine Luz, FNP

## 2016-06-10 NOTE — Assessment & Plan Note (Signed)
Symptoms and exam consistent with urinary tract infection. In office urinalysis is negative for leukocytes, nitrites, and hematuria which patient indicates is normal for her urinary tract infections. Urine will be sent for culture. Start sulfamethoxazole trimethoprim. Start fluconazole as needed for post antibiotic candidiasis. Follow-up if symptoms worsen or fail to improve.

## 2016-06-11 LAB — URINE CULTURE: Organism ID, Bacteria: 10000

## 2016-06-12 ENCOUNTER — Encounter: Payer: Self-pay | Admitting: Family

## 2016-09-29 NOTE — Progress Notes (Signed)
Subjective:    Patient ID: [name], female    DOB: Aug 13, 1976, 40 y.o.   MRN: [id number]  HPI She is here to establish with a new pcp.  She is here for a PE.   Hypertension:  She checks her BP at home and it runs 120's/70's consistently.  She was on medication in the past, but was able to come off.  She does have white coat hypertension.  She is not always compliant with a low sodium diet.  She does exercise regularly.    Anxiety, Fluid retention/bloating:  This past month she has been very bloated and it started in the middle of her cycle.  She feels it is hormonal.  She also has hot flashes, she is not sleeping, night sweats, irritability and she feels they are all hormonal.  She has not seen her gyn for about two years.  She think her mom went through menopause in her late 6140's or early 2150's.  Medications and allergies reviewed with patient and updated if appropriate.  Patient Active Problem List   Diagnosis Date Noted  . Panic anxiety syndrome 09/25/2013  . LIBIDO, DECREASED 09/29/2010  . Allergic rhinitis 03/25/2010  . HYPERTENSION, BENIGN SYSTEMIC 12/28/2006  . OVARIAN CYST 12/28/2006    No current outpatient prescriptions on file prior to visit.   No current facility-administered medications on file prior to visit.     Past Medical History:  Diagnosis Date  . ALLERGIC RHINITIS   . HYPERTENSION   . OVARIAN CYST   . Unspecified Anemia     Past Surgical History:  Procedure Laterality Date  . NO PAST SURGERIES      Social History   Social History  . Marital status: Married    Spouse name: N/A  . Number of children: N/A  . Years of education: N/A   Social History Main Topics  . Smoking status: Never Smoker  . Smokeless tobacco: Never Used     Comment: Married, lives with spouse and 3 children ; Christiane [place] and [place].   . Alcohol use No  . Drug use: No  . Sexual activity: Not Asked   Other Topics Concern  . None   Social History Narrative   Married, 3 kids   Exercise: Systems analystpersonal trainer 3/week, exercises on own 2/week   Not working    Family History  Problem Relation Age of Onset  . Hypertension Mother   . Cancer Father     Prostate  . Hypertension Sister   . Diabetes Cousin     Review of Systems  Constitutional: Positive for diaphoresis (hot flashes, night sweats). Negative for appetite change, chills, fever and unexpected weight change.  Respiratory: Negative for cough, shortness of breath and wheezing.   Cardiovascular: Negative for chest pain, palpitations and leg swelling.  Gastrointestinal: Positive for constipation. Negative for abdominal pain, blood in stool, diarrhea and nausea.       No gerd  Genitourinary: Negative for dysuria and hematuria.  Musculoskeletal: Negative for arthralgias.  Neurological: Negative for light-headedness and headaches.  Psychiatric/Behavioral: Negative for dysphoric mood. The patient is nervous/anxious.        Objective:   Vitals:   09/30/16 1010  BP: (!) 152/94  Pulse: 99  Resp: 16  Temp: 98.3 F (36.8 C)   Filed Weights   09/30/16 1010  Weight: 159 lb (72.1 kg)   Body mass index is 28.17 kg/m.   Physical Exam Constitutional: She appears well-developed and well-nourished. No distress.  HENT:  Head: Normocephalic and atraumatic.  Right Ear: External ear normal. Normal ear canal and TM Left Ear: External ear normal.  Normal ear canal and TM Mouth/Throat: Oropharynx is clear and moist.  Eyes: Conjunctivae  normal.  Neck: Neck supple. No tracheal deviation present. ? Slight thyromegaly present.  No carotid bruit  Cardiovascular: Normal rate, regular rhythm and normal heart sounds.   No murmur heard.  No edema. Pulmonary/Chest: Effort normal and breath sounds normal. No respiratory distress. She has no wheezes. She has no rales.  Breast: deferred to Gyn Abdominal: Soft. She exhibits no distension. There is no tenderness.  Lymphadenopathy: She has no cervical  adenopathy.  Skin: Skin is warm and dry. She is not diaphoretic.  Psychiatric: She has a normal mood and affect. Her behavior is normal.       Assessment & Plan:   Physical exam: Screening blood work  ordered Immunizations  Up to date  Gyn - will schedule Exercise - exercises regularly - 5/week Weight  - trying to lose weight  Substance abuse none  See Problem List for Assessment and Plan of chronic medical problems.   F/u annually

## 2016-09-30 ENCOUNTER — Encounter: Payer: Self-pay | Admitting: Internal Medicine

## 2016-09-30 ENCOUNTER — Ambulatory Visit (INDEPENDENT_AMBULATORY_CARE_PROVIDER_SITE_OTHER): Payer: Commercial Managed Care - HMO | Admitting: Internal Medicine

## 2016-09-30 ENCOUNTER — Other Ambulatory Visit (INDEPENDENT_AMBULATORY_CARE_PROVIDER_SITE_OTHER): Payer: Commercial Managed Care - HMO

## 2016-09-30 VITALS — BP 152/94 | HR 99 | Temp 98.3°F | Resp 16 | Ht 63.0 in | Wt 159.0 lb

## 2016-09-30 DIAGNOSIS — F410 Panic disorder [episodic paroxysmal anxiety] without agoraphobia: Secondary | ICD-10-CM

## 2016-09-30 DIAGNOSIS — I10 Essential (primary) hypertension: Secondary | ICD-10-CM

## 2016-09-30 DIAGNOSIS — Z0001 Encounter for general adult medical examination with abnormal findings: Secondary | ICD-10-CM | POA: Diagnosis not present

## 2016-09-30 DIAGNOSIS — Z0000 Encounter for general adult medical examination without abnormal findings: Secondary | ICD-10-CM

## 2016-09-30 LAB — COMPREHENSIVE METABOLIC PANEL
ALT: 19 U/L (ref 0–35)
AST: 17 U/L (ref 0–37)
Albumin: 4.2 g/dL (ref 3.5–5.2)
Alkaline Phosphatase: 86 U/L (ref 39–117)
BUN: 10 mg/dL (ref 6–23)
CO2: 23 mEq/L (ref 19–32)
Calcium: 9.5 mg/dL (ref 8.4–10.5)
Chloride: 105 mEq/L (ref 96–112)
Creatinine, Ser: 0.81 mg/dL (ref 0.40–1.20)
GFR: 101.79 mL/min (ref >60.00)
Glucose, Bld: 94 mg/dL (ref 70–99)
Potassium: 4.3 mEq/L (ref 3.5–5.1)
Sodium: 139 mEq/L (ref 135–145)
Total Bilirubin: 0.6 mg/dL (ref 0.2–1.2)
Total Protein: 7.6 g/dL (ref 6.0–8.3)

## 2016-09-30 LAB — CBC WITH DIFFERENTIAL/PLATELET
Basophils Absolute: 0.1 10*3/uL (ref 0.0–0.1)
Basophils Relative: 0.8 % (ref 0.0–3.0)
Eosinophils Absolute: 0.1 10*3/uL (ref 0.0–0.7)
Eosinophils Relative: 0.9 % (ref 0.0–5.0)
HCT: 39.6 % (ref 36.0–46.0)
Hemoglobin: 13.1 g/dL (ref 12.0–15.0)
Lymphocytes Relative: 21.5 % (ref 12.0–46.0)
Lymphs Abs: 2.3 10*3/uL (ref 0.7–4.0)
MCHC: 33.1 g/dL (ref 30.0–36.0)
MCV: 82.2 fl (ref 78.0–100.0)
Monocytes Absolute: 0.7 10*3/uL (ref 0.1–1.0)
Monocytes Relative: 6.4 % (ref 3.0–12.0)
Neutro Abs: 7.6 10*3/uL (ref 1.4–7.7)
Neutrophils Relative %: 70.4 % (ref 43.0–77.0)
Platelets: 262 10*3/uL (ref 150.0–400.0)
RBC: 4.82 Mil/uL (ref 3.87–5.11)
RDW: 14.7 % (ref 11.5–15.5)
WBC: 10.7 10*3/uL — ABNORMAL HIGH (ref 4.0–10.5)

## 2016-09-30 LAB — TSH: TSH: 1.27 u[IU]/mL (ref 0.35–4.50)

## 2016-09-30 LAB — LIPID PANEL
Cholesterol: 230 mg/dL — ABNORMAL HIGH (ref 0–200)
HDL: 66.1 mg/dL (ref >39.00)
LDL Cholesterol: 151 mg/dL — ABNORMAL HIGH (ref 0–99)
NonHDL: 163.61
Total CHOL/HDL Ratio: 3
Triglycerides: 65 mg/dL (ref 0.0–149.0)
VLDL: 13 mg/dL (ref 0.0–40.0)

## 2016-09-30 LAB — T4, FREE: Free T4: 0.69 ng/dL (ref 0.60–1.60)

## 2016-09-30 MED ORDER — B-12 1000 MCG PO CAPS: ORAL_CAPSULE | ORAL | Status: AC

## 2016-09-30 MED ORDER — PROBIOTIC ACIDOPHILUS PO CAPS: 1.0000 | ORAL_CAPSULE | Freq: Every day | ORAL | Status: DC

## 2016-09-30 MED ORDER — MAGNESIUM 400 MG PO CAPS: ORAL_CAPSULE | ORAL | Status: DC

## 2016-09-30 MED ORDER — GARLIC 100 MG PO TABS: ORAL_TABLET | ORAL | 0 refills | Status: DC

## 2016-09-30 NOTE — Assessment & Plan Note (Addendum)
controlled at home off medication She will continue to monitor at home Continue regular exercise, weight loss efforts Stressed low sodium diet cmp

## 2016-09-30 NOTE — Progress Notes (Signed)
Pre visit review using our clinic review tool, if applicable. No additional management support is needed unless otherwise documented below in the visit note. 

## 2016-09-30 NOTE — Assessment & Plan Note (Signed)
She does have some anxiety, but some of her recently symptoms may be hormonal vs anxiety Will check basic labs She will see gyn to r/o hormonal cause Discussed possible SSRI, which she has been on in the past

## 2016-09-30 NOTE — Patient Instructions (Addendum)
Test(s) ordered today. Your results will be released to MyChart (or called to you) after review, usually within 72hours after test completion. If any changes need to be made, you will be notified at that same time.  All other Health Maintenance issues reviewed.   All recommended immunizations and age-appropriate screenings are up-to-date or discussed.  No immunizations administered today.   Medications reviewed and updated.  No changes recommended at this time.   Please followup in one year for a physical exam   Health Maintenance, Female Introduction Adopting a healthy lifestyle and getting preventive care can go a long way to promote health and wellness. Talk with your health care provider about what schedule of regular examinations is right for you. This is a good chance for you to check in with your provider about disease prevention and staying healthy. In between checkups, there are plenty of things you can do on your own. Experts have done a lot of research about which lifestyle changes and preventive measures are most likely to keep you healthy. Ask your health care provider for more information. Weight and diet Eat a healthy diet  Be sure to include plenty of vegetables, fruits, low-fat dairy products, and lean protein.  Do not eat a lot of foods high in solid fats, added sugars, or salt.  Get regular exercise. This is one of the most important things you can do for your health.  Most adults should exercise for at least 150 minutes each week. The exercise should increase your heart rate and make you sweat (moderate-intensity exercise).  Most adults should also do strengthening exercises at least twice a week. This is in addition to the moderate-intensity exercise. Maintain a healthy weight  Body mass index (BMI) is a measurement that can be used to identify possible weight problems. It estimates body fat based on height and weight. Your health care provider can help determine  your BMI and help you achieve or maintain a healthy weight.  For females 20 years of age and older:  A BMI below 18.5 is considered underweight.  A BMI of 18.5 to 24.9 is normal.  A BMI of 25 to 29.9 is considered overweight.  A BMI of 30 and above is considered obese. Watch levels of cholesterol and blood lipids  You should start having your blood tested for lipids and cholesterol at 38 years of age, then have this test every 5 years.  You may need to have your cholesterol levels checked more often if:  Your lipid or cholesterol levels are high.  You are older than 38 years of age.  You are at high risk for heart disease. Cancer screening Lung Cancer  Lung cancer screening is recommended for adults 55-80 years old who are at high risk for lung cancer because of a history of smoking.  A yearly low-dose CT scan of the lungs is recommended for people who:  Currently smoke.  Have quit within the past 15 years.  Have at least a 30-pack-year history of smoking. A pack year is smoking an average of one pack of cigarettes a day for 1 year.  Yearly screening should continue until it has been 15 years since you quit.  Yearly screening should stop if you develop a health problem that would prevent you from having lung cancer treatment. Breast Cancer  Practice breast self-awareness. This means understanding how your breasts normally appear and feel.  It also means doing regular breast self-exams. Let your health care provider know about any   changes, no matter how small.  If you are in your 20s or 30s, you should have a clinical breast exam (CBE) by a health care provider every 1-3 years as part of a regular health exam.  If you are 40 or older, have a CBE every year. Also consider having a breast X-ray (mammogram) every year.  If you have a family history of breast cancer, talk to your health care provider about genetic screening.  If you are at high risk for breast cancer,  talk to your health care provider about having an MRI and a mammogram every year.  Breast cancer gene (BRCA) assessment is recommended for women who have family members with BRCA-related cancers. BRCA-related cancers include:  Breast.  Ovarian.  Tubal.  Peritoneal cancers.  Results of the assessment will determine the need for genetic counseling and BRCA1 and BRCA2 testing. Cervical Cancer  Your health care provider may recommend that you be screened regularly for cancer of the pelvic organs (ovaries, uterus, and vagina). This screening involves a pelvic examination, including checking for microscopic changes to the surface of your cervix (Pap test). You may be encouraged to have this screening done every 3 years, beginning at age 21.  For women ages 30-65, health care providers may recommend pelvic exams and Pap testing every 3 years, or they may recommend the Pap and pelvic exam, combined with testing for human papilloma virus (HPV), every 5 years. Some types of HPV increase your risk of cervical cancer. Testing for HPV may also be done on women of any age with unclear Pap test results.  Other health care providers may not recommend any screening for nonpregnant women who are considered low risk for pelvic cancer and who do not have symptoms. Ask your health care provider if a screening pelvic exam is right for you.  If you have had past treatment for cervical cancer or a condition that could lead to cancer, you need Pap tests and screening for cancer for at least 20 years after your treatment. If Pap tests have been discontinued, your risk factors (such as having a new sexual partner) need to be reassessed to determine if screening should resume. Some women have medical problems that increase the chance of getting cervical cancer. In these cases, your health care provider may recommend more frequent screening and Pap tests. Colorectal Cancer  This type of cancer can be detected and often  prevented.  Routine colorectal cancer screening usually begins at 38 years of age and continues through 38 years of age.  Your health care provider may recommend screening at an earlier age if you have risk factors for colon cancer.  Your health care provider may also recommend using home test kits to check for hidden blood in the stool.  A small camera at the end of a tube can be used to examine your colon directly (sigmoidoscopy or colonoscopy). This is done to check for the earliest forms of colorectal cancer.  Routine screening usually begins at age 50.  Direct examination of the colon should be repeated every 5-10 years through 38 years of age. However, you may need to be screened more often if early forms of precancerous polyps or small growths are found. Skin Cancer  Check your skin from head to toe regularly.  Tell your health care provider about any new moles or changes in moles, especially if there is a change in a mole's shape or color.  Also tell your health care provider if you have   a mole that is larger than the size of a pencil eraser.  Always use sunscreen. Apply sunscreen liberally and repeatedly throughout the day.  Protect yourself by wearing long sleeves, pants, a wide-brimmed hat, and sunglasses whenever you are outside. Heart disease, diabetes, and high blood pressure  High blood pressure causes heart disease and increases the risk of stroke. High blood pressure is more likely to develop in:  People who have blood pressure in the high end of the normal range (130-139/85-89 mm Hg).  People who are overweight or obese.  People who are African American.  If you are 89-39 years of age, have your blood pressure checked every 3-5 years. If you are 55 years of age or older, have your blood pressure checked every year. You should have your blood pressure measured twice-once when you are at a hospital or clinic, and once when you are not at a hospital or clinic. Record  the average of the two measurements. To check your blood pressure when you are not at a hospital or clinic, you can use:  An automated blood pressure machine at a pharmacy.  A home blood pressure monitor.  If you are between 19 years and 33 years old, ask your health care provider if you should take aspirin to prevent strokes.  Have regular diabetes screenings. This involves taking a blood sample to check your fasting blood sugar level.  If you are at a normal weight and have a low risk for diabetes, have this test once every three years after 38 years of age.  If you are overweight and have a high risk for diabetes, consider being tested at a younger age or more often. Preventing infection Hepatitis B  If you have a higher risk for hepatitis B, you should be screened for this virus. You are considered at high risk for hepatitis B if:  You were born in a country where hepatitis B is common. Ask your health care provider which countries are considered high risk.  Your parents were born in a high-risk country, and you have not been immunized against hepatitis B (hepatitis B vaccine).  You have HIV or AIDS.  You use needles to inject street drugs.  You live with someone who has hepatitis B.  You have had sex with someone who has hepatitis B.  You get hemodialysis treatment.  You take certain medicines for conditions, including cancer, organ transplantation, and autoimmune conditions. Hepatitis C  Blood testing is recommended for:  Everyone born from [birth year] through 1963.  Anyone with known risk factors for hepatitis C. Sexually transmitted infections (STIs)  You should be screened for sexually transmitted infections (STIs) including gonorrhea and chlamydia if:  You are sexually active and are younger than 38 years of age.  You are older than 38 years of age and your health care provider tells you that you are at risk for this type of infection.  Your sexual activity has  changed since you were last screened and you are at an increased risk for chlamydia or gonorrhea. Ask your health care provider if you are at risk.  If you do not have HIV, but are at risk, it may be recommended that you take a prescription medicine daily to prevent HIV infection. This is called pre-exposure prophylaxis (PrEP). You are considered at risk if:  You are sexually active and do not regularly use condoms or know the HIV status of your partner(s).  You take drugs by injection.  You are sexually  active with a partner who has HIV. Talk with your health care provider about whether you are at high risk of being infected with HIV. If you choose to begin PrEP, you should first be tested for HIV. You should then be tested every 3 months for as long as you are taking PrEP. Pregnancy  If you are premenopausal and you may become pregnant, ask your health care provider about preconception counseling.  If you may become pregnant, take 400 to 800 micrograms (mcg) of folic acid every day.  If you want to prevent pregnancy, talk to your health care provider about birth control (contraception). Osteoporosis and menopause  Osteoporosis is a disease in which the bones lose minerals and strength with aging. This can result in serious bone fractures. Your risk for osteoporosis can be identified using a bone density scan.  If you are 65 years of age or older, or if you are at risk for osteoporosis and fractures, ask your health care provider if you should be screened.  Ask your health care provider whether you should take a calcium or vitamin D supplement to lower your risk for osteoporosis.  Menopause may have certain physical symptoms and risks.  Hormone replacement therapy may reduce some of these symptoms and risks. Talk to your health care provider about whether hormone replacement therapy is right for you. Follow these instructions at home:  Schedule regular health, dental, and eye  exams.  Stay current with your immunizations.  Do not use any tobacco products including cigarettes, chewing tobacco, or electronic cigarettes.  If you are pregnant, do not drink alcohol.  If you are breastfeeding, limit how much and how often you drink alcohol.  Limit alcohol intake to no more than 1 drink per day for nonpregnant women. One drink equals 12 ounces of beer, 5 ounces of wine, or 1 ounces of hard liquor.  Do not use street drugs.  Do not share needles.  Ask your health care provider for help if you need support or information about quitting drugs.  Tell your health care provider if you often feel depressed.  Tell your health care provider if you have ever been abused or do not feel safe at home. This information is not intended to replace advice given to you by your health care provider. Make sure you discuss any questions you have with your health care provider. Document Released: 05/02/2011 Document Revised: 03/24/2016 Document Reviewed: 07/21/2015  2017 Elsevier    

## 2016-10-01 ENCOUNTER — Encounter: Payer: Self-pay | Admitting: Internal Medicine

## 2016-10-08 MED ORDER — ALPRAZOLAM 0.5 MG PO TABS: 0.2500 mg | ORAL_TABLET | Freq: Every day | ORAL | 1 refills | Status: DC | PRN

## 2016-10-17 ENCOUNTER — Encounter: Payer: Self-pay | Admitting: Internal Medicine

## 2017-06-13 ENCOUNTER — Telehealth: Payer: Self-pay | Admitting: Internal Medicine

## 2017-06-13 DIAGNOSIS — Z23 Encounter for immunization: Secondary | ICD-10-CM | POA: Diagnosis not present

## 2017-06-13 NOTE — Telephone Encounter (Signed)
Ok to schedule nurse visit.

## 2017-06-13 NOTE — Telephone Encounter (Signed)
Pt called stating that she is needing an injection for school. She said that she is due for the second MMR and received the first one back in 2007 at school ([employer]). Would it be okay for her to come in for just a nurse visit or would you like to see her as well/ She was last seen by you in December of 2017.

## 2017-06-14 NOTE — Telephone Encounter (Signed)
LM for pt to schedule a nurse visit for MMR#2 °

## 2017-07-26 ENCOUNTER — Ambulatory Visit: Payer: 59 | Admitting: Adult Health

## 2017-10-10 ENCOUNTER — Ambulatory Visit: Payer: Self-pay | Admitting: Internal Medicine

## 2017-10-10 NOTE — Progress Notes (Signed)
Subjective:    Patient ID: Derrel NipNicole Cavendish, female    DOB: 11-13-1975, 41 y.o.   MRN: [id number]  HPI The patient is here for follow up.  She is in school and her mom was just in the hospital.  Her husband will be laid off next week. She works out 5 days/week.  She is on a strict gluten free diet.   Her anxiety is not controlled.  It is worse around her menstrual cycle.  She gets overwhelmed at times.  She uses the xanax as needed.  It works well, but she does not want to use it daily.  The other day she had a panic attack.   Hypertension: She is taking her medication daily. She is compliant with a low sodium diet.  She denies chest pain, palpitations, edema, shortness of breath and regular headaches. She is exercising regularly.  She does not monitor her blood pressure at home.      Medications and allergies reviewed with patient and updated if appropriate.  Patient Active Problem List   Diagnosis Date Noted  . Panic anxiety syndrome 09/25/2013  . LIBIDO, DECREASED 09/29/2010  . Allergic rhinitis 03/25/2010  . HYPERTENSION, BENIGN SYSTEMIC 12/28/2006  . OVARIAN CYST 12/28/2006    Current Outpatient Medications on File Prior to Visit  Medication Sig Dispense Refill  . ALPRAZolam (XANAX) 0.5 MG tablet Take 0.5-1 tablets (0.25-0.5 mg total) by mouth daily as needed for anxiety. 30 tablet 1  . calcium carbonate (OSCAL) 1500 (600 Ca) MG TABS tablet Take 1,500 mg by mouth 3 (three) times daily with meals.    . Cyanocobalamin (B-12) 1000 MCG CAPS Takes one daily    . Lactobacillus (PROBIOTIC ACIDOPHILUS) CAPS Take 1 capsule by mouth daily.     No current facility-administered medications on file prior to visit.     Past Medical History:  Diagnosis Date  . ALLERGIC RHINITIS   . HYPERTENSION   . OVARIAN CYST   . Unspecified Anemia     Past Surgical History:  Procedure Laterality Date  . NO PAST SURGERIES      Social History   Socioeconomic History  . Marital status:  Married    Spouse name: Not on file  . Number of children: Not on file  . Years of education: Not on file  . Highest education level: Not on file  Social Needs  . Financial resource strain: Not on file  . Food insecurity - worry: Not on file  . Food insecurity - inability: Not on file  . Transportation needs - medical: Not on file  . Transportation needs - non-medical: Not on file  Occupational History  . Not on file  Tobacco Use  . Smoking status: Never Smoker  . Smokeless tobacco: Never Used  . Tobacco comment: Married, lives with spouse and 3 children ; [place] and [place].   Substance and Sexual Activity  . Alcohol use: No  . Drug use: No  . Sexual activity: Not on file  Other Topics Concern  . Not on file  Social History Narrative   Married, 3 kids   Exercise: personal trainer 3/week, exercises on own 2/week   Not working    Family History  Problem Relation Age of Onset  . Hypertension Mother   . Cancer Father        Prostate  . Hypertension Sister   . Diabetes Cousin     Review of Systems  Constitutional: Positive for appetite change (decreased when depressed). Negative  for fever.  HENT: Positive for sneezing.   Respiratory: Negative for shortness of breath.   Cardiovascular: Negative for chest pain, palpitations and leg swelling.  Neurological: Negative for dizziness, light-headedness and headaches.  Psychiatric/Behavioral: Positive for dysphoric mood. The patient is nervous/anxious.        Objective:   Vitals:   10/13/17 1525  BP: (!) 162/105  Pulse: 95  Resp: (!) 22  Temp: 98.8 F (37.1 C)  SpO2: 99%   Wt Readings from Last 3 Encounters:  10/13/17 134 lb (60.8 kg)  09/30/16 159 lb (72.1 kg)  06/10/16 162 lb (73.5 kg)   Body mass index is 23.74 kg/m.   Physical Exam    Constitutional: Appears well-developed and well-nourished. No distress.  HENT:  Head: Normocephalic and atraumatic.  Neck: Neck supple. No tracheal deviation present. No  thyromegaly present.  No cervical lymphadenopathy Cardiovascular: Normal rate, regular rhythm and normal heart sounds.   No murmur heard. No carotid bruit .  No edema Pulmonary/Chest: Effort normal and breath sounds normal. No respiratory distress. No has no wheezes. No rales.  Skin: Skin is warm and dry. Not diaphoretic.  Psychiatric: anxious mood and affect. Behavior is normal.      Assessment & Plan:    See Problem List for Assessment and Plan of chronic medical problems.

## 2017-10-12 ENCOUNTER — Encounter: Payer: Self-pay | Admitting: Internal Medicine

## 2017-10-13 ENCOUNTER — Ambulatory Visit: Payer: 59 | Admitting: Internal Medicine

## 2017-10-13 VITALS — BP 162/105 | HR 95 | Temp 98.8°F | Resp 22 | Ht 63.0 in | Wt 134.0 lb

## 2017-10-13 DIAGNOSIS — I10 Essential (primary) hypertension: Secondary | ICD-10-CM

## 2017-10-13 DIAGNOSIS — F410 Panic disorder [episodic paroxysmal anxiety] without agoraphobia: Secondary | ICD-10-CM | POA: Diagnosis not present

## 2017-10-13 MED ORDER — ALPRAZOLAM 0.5 MG PO TABS: 0.2500 mg | ORAL_TABLET | Freq: Every day | ORAL | 1 refills | Status: DC | PRN

## 2017-10-13 MED ORDER — PAROXETINE HCL 20 MG PO TABS: 20.0000 mg | ORAL_TABLET | Freq: Every day | ORAL | 5 refills | Status: DC

## 2017-10-13 NOTE — Patient Instructions (Signed)
   Medications reviewed and updated.  Changes include starting paxil.  Use the xanax as needed.   Your prescription(s) have been submitted to your pharmacy. Please take as directed and contact our office if you believe you are having problem(s) with the medication(s).   Please followup in 4 weeks

## 2017-10-14 ENCOUNTER — Encounter: Payer: Self-pay | Admitting: Internal Medicine

## 2017-10-14 NOTE — Assessment & Plan Note (Signed)
She has known whitecoat hypertension Blood pressure very elevated here today and she is extremely anxious Advised her to start monitoring at home ?  Needs medication or not Follow-up in 1 month Continue regular exercise Low-sodium diet

## 2017-10-14 NOTE — Assessment & Plan Note (Signed)
She is experiencing increased anxiety, healthy dietIt is currently not controlled with regular exercise and taking Xanax as needed We both feel she would benefit from a daily medication She has been on a low dose of Paxil in the past and did well with it Start Paxil 20 mg daily Follow-up in 4 weeks, sooner if needed

## 2017-11-06 ENCOUNTER — Other Ambulatory Visit: Payer: Self-pay | Admitting: Emergency Medicine

## 2017-11-06 MED ORDER — PAROXETINE HCL 20 MG PO TABS: 20.0000 mg | ORAL_TABLET | Freq: Every day | ORAL | 1 refills | Status: DC

## 2017-11-09 ENCOUNTER — Ambulatory Visit: Payer: 59 | Admitting: Internal Medicine

## 2017-11-09 ENCOUNTER — Encounter: Payer: Self-pay | Admitting: Internal Medicine

## 2017-11-09 ENCOUNTER — Other Ambulatory Visit (INDEPENDENT_AMBULATORY_CARE_PROVIDER_SITE_OTHER): Payer: 59

## 2017-11-09 VITALS — BP 162/114 | HR 104 | Temp 98.7°F | Resp 16 | Wt 129.0 lb

## 2017-11-09 DIAGNOSIS — I10 Essential (primary) hypertension: Secondary | ICD-10-CM

## 2017-11-09 DIAGNOSIS — K9041 Non-celiac gluten sensitivity: Secondary | ICD-10-CM | POA: Diagnosis not present

## 2017-11-09 DIAGNOSIS — F410 Panic disorder [episodic paroxysmal anxiety] without agoraphobia: Secondary | ICD-10-CM | POA: Diagnosis not present

## 2017-11-09 LAB — COMPREHENSIVE METABOLIC PANEL
ALT: 22 U/L (ref 0–35)
AST: 20 U/L (ref 0–37)
Albumin: 4.3 g/dL (ref 3.5–5.2)
Alkaline Phosphatase: 81 U/L (ref 39–117)
BUN: 9 mg/dL (ref 6–23)
CO2: 27 mEq/L (ref 19–32)
Calcium: 9.1 mg/dL (ref 8.4–10.5)
Chloride: 104 mEq/L (ref 96–112)
Creatinine, Ser: 0.86 mg/dL (ref 0.40–1.20)
GFR: 94.43 mL/min (ref >60.00)
Glucose, Bld: 94 mg/dL (ref 70–99)
Potassium: 4.1 mEq/L (ref 3.5–5.1)
Sodium: 137 mEq/L (ref 135–145)
Total Bilirubin: 0.7 mg/dL (ref 0.2–1.2)
Total Protein: 7.3 g/dL (ref 6.0–8.3)

## 2017-11-09 LAB — CBC WITH DIFFERENTIAL/PLATELET
Basophils Absolute: 0.1 10*3/uL (ref 0.0–0.1)
Basophils Relative: 0.9 % (ref 0.0–3.0)
Eosinophils Absolute: 0.2 10*3/uL (ref 0.0–0.7)
Eosinophils Relative: 3.1 % (ref 0.0–5.0)
HCT: 40.8 % (ref 36.0–46.0)
Hemoglobin: 13.2 g/dL (ref 12.0–15.0)
Lymphocytes Relative: 40.7 % (ref 12.0–46.0)
Lymphs Abs: 2.7 10*3/uL (ref 0.7–4.0)
MCHC: 32.3 g/dL (ref 30.0–36.0)
MCV: 85.8 fl (ref 78.0–100.0)
Monocytes Absolute: 0.6 10*3/uL (ref 0.1–1.0)
Monocytes Relative: 8.4 % (ref 3.0–12.0)
Neutro Abs: 3.2 10*3/uL (ref 1.4–7.7)
Neutrophils Relative %: 46.9 % (ref 43.0–77.0)
Platelets: 192 10*3/uL (ref 150.0–400.0)
RBC: 4.76 Mil/uL (ref 3.87–5.11)
RDW: 14.6 % (ref 11.5–15.5)
WBC: 6.7 10*3/uL (ref 4.0–10.5)

## 2017-11-09 LAB — TSH: TSH: 0.79 u[IU]/mL (ref 0.35–4.50)

## 2017-11-09 LAB — MAGNESIUM: Magnesium: 2.1 mg/dL (ref 1.5–2.5)

## 2017-11-09 LAB — VITAMIN B12: Vitamin B-12: >1500 pg/mL — ABNORMAL HIGH (ref 211–911)

## 2017-11-09 NOTE — Assessment & Plan Note (Addendum)
Has some element of whitecoat hypertension, but?  Is her blood pressure truly elevated/hypertension Stressed monitoring it regularly-discussed goal of less than 130/90 Continue low-sodium diet, regular exercise CBC, CMP, TSH

## 2017-11-09 NOTE — Progress Notes (Signed)
All   Subjective:    Patient ID: [name], female    DOB: 1976-08-09, 42 y.o.   MRN: [id number]  HPI The patient is here for follow up.  Anxiety: She is taking her Paxil daily as prescribed. She takes the xanax closer to her cycle when her anxiety increases.  She denies any side effects from the medication, except some fatigue and yawning, which are minor. She feels her anxiety is well controlled and she is happy with her current dose of medication.   Elevated BP:  She is not on any medication.  She does have white coat hypertension.  She has not been checking her BP.  She eats healthy and exercises regularly.  She does have a strong family history of high blood pressure.  Gluten sensitivity:  She is fairly gluten free.  If she eats something with gluten she is bloated, has increased gas, dizzy and nauseous.  She has been avoided gluten for over one year.  She has never been tested for celiac.  She wonders about testing for celiac disease and nutritional deficiencies related to celiac.  She is taking vitamin B12 daily.  Medications and allergies reviewed with patient and updated if appropriate.  Patient Active Problem List   Diagnosis Date Noted  . Panic anxiety syndrome 09/25/2013  . LIBIDO, DECREASED 09/29/2010  . Allergic rhinitis 03/25/2010  . HYPERTENSION, BENIGN SYSTEMIC 12/28/2006  . OVARIAN CYST 12/28/2006    Current Outpatient Medications on File Prior to Visit  Medication Sig Dispense Refill  . ALPRAZolam (XANAX) 0.5 MG tablet Take 0.5-1 tablets (0.25-0.5 mg total) by mouth daily as needed for anxiety. 30 tablet 1  . calcium carbonate (OSCAL) 1500 (600 Ca) MG TABS tablet Take 1,500 mg by mouth 3 (three) times daily with meals.    . Cyanocobalamin (B-12) 1000 MCG CAPS Takes one daily    . Lactobacillus (PROBIOTIC ACIDOPHILUS) CAPS Take 1 capsule by mouth daily.    Marland Kitchen. PARoxetine (PAXIL) 20 MG tablet Take 1 tablet (20 mg total) by mouth daily. 90 tablet 1   No current  facility-administered medications on file prior to visit.     Past Medical History:  Diagnosis Date  . ALLERGIC RHINITIS   . HYPERTENSION   . OVARIAN CYST   . Unspecified Anemia     Past Surgical History:  Procedure Laterality Date  . NO PAST SURGERIES      Social History   Socioeconomic History  . Marital status: Married    Spouse name: None  . Number of children: None  . Years of education: None  . Highest education level: None  Social Needs  . Financial resource strain: None  . Food insecurity - worry: None  . Food insecurity - inability: None  . Transportation needs - medical: None  . Transportation needs - non-medical: None  Occupational History  . None  Tobacco Use  . Smoking status: Never Smoker  . Smokeless tobacco: Never Used  . Tobacco comment: Married, lives with spouse and 3 children ; [place]e [place] and [place].   Substance and Sexual Activity  . Alcohol use: No  . Drug use: No  . Sexual activity: None  Other Topics Concern  . None  Social History Narrative   Married, 3 kids   Exercise: Systems analystpersonal trainer 3/week, exercises on own 2/week   Not working    Family History  Problem Relation Age of Onset  . Hypertension Mother   . Cancer Father  Prostate  . Hypertension Sister   . Diabetes Cousin     Review of Systems  Constitutional: Negative for fever.  Respiratory: Negative for shortness of breath.   Cardiovascular: Negative for chest pain, palpitations and leg swelling.  Gastrointestinal: Positive for nausea (occ with menses).       Abdominal bloating, increased gas with certain foods  Neurological: Positive for dizziness (occ with menses). Negative for headaches.       Objective:   Vitals:   11/09/17 1043  BP: (!) 162/114  Pulse: (!) 104  Resp: 16  Temp: 98.7 F (37.1 C)  SpO2: 98%   Wt Readings from Last 3 Encounters:  11/09/17 129 lb (58.5 kg)  10/13/17 134 lb (60.8 kg)  09/30/16 159 lb (72.1 kg)   Body mass index is 22.85  kg/m.   Physical Exam    Constitutional: Appears well-developed and well-nourished. No distress.  HENT:  Head: Normocephalic and atraumatic.  Neck: Neck supple. No tracheal deviation present. No thyromegaly present.  No cervical lymphadenopathy Cardiovascular: Normal rate, regular rhythm and normal heart sounds.   No murmur heard. No carotid bruit .  No edema Pulmonary/Chest: Effort normal and breath sounds normal. No respiratory distress. No has no wheezes. No rales.  Skin: Skin is warm and dry. Not diaphoretic.  Psychiatric: Normal mood and affect. Behavior is normal.      Assessment & Plan:    See Problem List for Assessment and Plan of chronic medical problems.

## 2017-11-09 NOTE — Patient Instructions (Addendum)
Monitor your BP at home - the goal is less than 130/80.  Test(s) ordered today. Your results will be released to MyChart (or called to you) after review, usually within 72hours after test completion. If any changes need to be made, you will be notified at that same time.   Medications reviewed and updated.  No changes recommended at this time.   Please followup in 6 months

## 2017-11-09 NOTE — Assessment & Plan Note (Addendum)
Improved, controlled Continue current dose of medication-Paxil 20 mg daily and Xanax as needed

## 2017-11-09 NOTE — Assessment & Plan Note (Signed)
Based on her symptoms she definitely has gluten sensitivity,?  Celiac disease.  She understands this may be falsely negative Continue gluten-free diet Will test for celiac disease We will rule out thyroid disease We will check blood work for possible malabsorption and vitamin D deficiency, B12, magnesium, CMP, CBC

## 2017-11-11 ENCOUNTER — Encounter: Payer: Self-pay | Admitting: Internal Medicine

## 2018-08-02 DIAGNOSIS — H52223 Regular astigmatism, bilateral: Secondary | ICD-10-CM | POA: Diagnosis not present

## 2018-08-02 DIAGNOSIS — H5213 Myopia, bilateral: Secondary | ICD-10-CM | POA: Diagnosis not present

## 2018-11-16 ENCOUNTER — Ambulatory Visit: Payer: 59 | Admitting: Internal Medicine

## 2018-11-22 NOTE — Progress Notes (Deleted)
Subjective:    Patient ID: [name], female    DOB: [birth year], 43 y.o.   MRN: [id number]  HPI The patient is here for follow up.  Anxiety:  She is taking paxil 20 mg daily.  She takes xanax as needed     Medications and allergies reviewed with patient and updated if appropriate.  Patient Active Problem List   Diagnosis Date Noted  . Non-celiac gluten sensitivity 11/09/2017  . Panic anxiety syndrome 09/25/2013  . LIBIDO, DECREASED 09/29/2010  . Allergic rhinitis 03/25/2010  . HYPERTENSION, BENIGN SYSTEMIC 12/28/2006  . OVARIAN CYST 12/28/2006    Current Outpatient Medications on File Prior to Visit  Medication Sig Dispense Refill  . ALPRAZolam (XANAX) 0.5 MG tablet Take 0.5-1 tablets (0.25-0.5 mg total) by mouth daily as needed for anxiety. 30 tablet 1  . calcium carbonate (OSCAL) 1500 (600 Ca) MG TABS tablet Take 1,500 mg by mouth 3 (three) times daily with meals.    . Cyanocobalamin (B-12) 1000 MCG CAPS Takes one daily    . Lactobacillus (PROBIOTIC ACIDOPHILUS) CAPS Take 1 capsule by mouth daily.    Marland Kitchen PARoxetine (PAXIL) 20 MG tablet Take 1 tablet (20 mg total) by mouth daily. 90 tablet 1   No current facility-administered medications on file prior to visit.     Past Medical History:  Diagnosis Date  . ALLERGIC RHINITIS   . HYPERTENSION   . OVARIAN CYST   . Unspecified Anemia     Past Surgical History:  Procedure Laterality Date  . NO PAST SURGERIES      Social History   Socioeconomic History  . Marital status: Married    Spouse name: Not on file  . Number of children: Not on file  . Years of education: Not on file  . Highest education level: Not on file  Occupational History  . Not on file  Social Needs  . Financial resource strain: Not on file  . Food insecurity:    Worry: Not on file    Inability: Not on file  . Transportation needs:    Medical: Not on file    Non-medical: Not on file  Tobacco Use  . Smoking status: Never Smoker  .  Smokeless tobacco: Never Used  . Tobacco comment: Married, lives with spouse and 3 children ; [place]e [place] and [place].   Substance and Sexual Activity  . Alcohol use: No  . Drug use: No  . Sexual activity: Not on file  Lifestyle  . Physical activity:    Days per week: Not on file    Minutes per session: Not on file  . Stress: Not on file  Relationships  . Social connections:    Talks on phone: Not on file    Gets together: Not on file    Attends religious service: Not on file    Active member of club or organization: Not on file    Attends meetings of clubs or organizations: Not on file    Relationship status: Not on file  Other Topics Concern  . Not on file  Social History Narrative   Married, 3 kids   Exercise: personal trainer 3/week, exercises on own 2/week   Not working    Family History  Problem Relation Age of Onset  . Hypertension Mother   . Cancer Father        Prostate  . Hypertension Sister   . Diabetes Cousin     Review of Systems     Objective:  There were no vitals filed for this visit. BP Readings from Last 3 Encounters:  11/09/17 (!) 162/114  10/13/17 (!) 162/105  09/30/16 (!) 152/94   Wt Readings from Last 3 Encounters:  11/09/17 129 lb (58.5 kg)  10/13/17 134 lb (60.8 kg)  09/30/16 159 lb (72.1 kg)   There is no height or weight on file to calculate BMI.   Physical Exam    Constitutional: Appears well-developed and well-nourished. No distress.  HENT:  Head: Normocephalic and atraumatic.  Neck: Neck supple. No tracheal deviation present. No thyromegaly present.  No cervical lymphadenopathy Cardiovascular: Normal rate, regular rhythm and normal heart sounds.   No murmur heard. No carotid bruit .  No edema Pulmonary/Chest: Effort normal and breath sounds normal. No respiratory distress. No has no wheezes. No rales.  Skin: Skin is warm and dry. Not diaphoretic.  Psychiatric: Normal mood and affect. Behavior is normal.      Assessment &  Plan:    See Problem List for Assessment and Plan of chronic medical problems.

## 2018-11-23 ENCOUNTER — Ambulatory Visit: Payer: 59 | Admitting: Internal Medicine

## 2018-11-29 NOTE — Progress Notes (Signed)
Subjective:    Patient ID: [name], female    DOB: 1976/05/05, 43 y.o.   MRN: [id number]  HPI The patient is here for an acute visit.  Anxiety:  She is having really bad anxiety.  She also states some depression.  She was taking Paxil daily and Xanax as needed, but has not been taking them on a regular basis and she thinks she needs to go back to taking them.  She is seeing a therapist and is doing everything natural to help with her anxiety and depression, but it does not seem to be enough.  She exercises regularly, does deep breathing.  She did well on the above medications.    Elevated blood pressure: Her blood pressure here is elevated and typically has been high when she comes here.  She does monitor her blood pressure at home and it is always well controlled typically in the 120s/80s.  She eats very healthy and has maintained a healthy weight.   Medications and allergies reviewed with patient and updated if appropriate.  Patient Active Problem List   Diagnosis Date Noted  . Non-celiac gluten sensitivity 11/09/2017  . Panic anxiety syndrome 09/25/2013  . LIBIDO, DECREASED 09/29/2010  . Allergic rhinitis 03/25/2010  . HYPERTENSION, BENIGN SYSTEMIC 12/28/2006  . OVARIAN CYST 12/28/2006    Current Outpatient Medications on File Prior to Visit  Medication Sig Dispense Refill  . calcium carbonate (OSCAL) 1500 (600 Ca) MG TABS tablet Take 1,500 mg by mouth 3 (three) times daily with meals.    . Cyanocobalamin (B-12) 1000 MCG CAPS Takes one daily    . Lactobacillus (PROBIOTIC ACIDOPHILUS) CAPS Take 1 capsule by mouth daily.    Marland Kitchen ALPRAZolam (XANAX) 0.5 MG tablet Take 0.5-1 tablets (0.25-0.5 mg total) by mouth daily as needed for anxiety. (Patient not taking: Reported on 11/30/2018) 30 tablet 1  . PARoxetine (PAXIL) 20 MG tablet Take 1 tablet (20 mg total) by mouth daily. (Patient not taking: Reported on 11/30/2018) 90 tablet 1   No current facility-administered medications on  file prior to visit.     Past Medical History:  Diagnosis Date  . ALLERGIC RHINITIS   . HYPERTENSION   . OVARIAN CYST   . Unspecified Anemia     Past Surgical History:  Procedure Laterality Date  . NO PAST SURGERIES      Social History   Socioeconomic History  . Marital status: Married    Spouse name: Not on file  . Number of children: Not on file  . Years of education: Not on file  . Highest education level: Not on file  Occupational History  . Not on file  Social Needs  . Financial resource strain: Not on file  . Food insecurity:    Worry: Not on file    Inability: Not on file  . Transportation needs:    Medical: Not on file    Non-medical: Not on file  Tobacco Use  . Smoking status: Never Smoker  . Smokeless tobacco: Never Used  . Tobacco comment: Married, lives with spouse and 3 children ; [place]e [place] and [place].   Substance and Sexual Activity  . Alcohol use: No  . Drug use: No  . Sexual activity: Not on file  Lifestyle  . Physical activity:    Days per week: Not on file    Minutes per session: Not on file  . Stress: Not on file  Relationships  . Social connections:    Talks on phone: Not  on file    Gets together: Not on file    Attends religious service: Not on file    Active member of club or organization: Not on file    Attends meetings of clubs or organizations: Not on file    Relationship status: Not on file  Other Topics Concern  . Not on file  Social History Narrative   Married, 3 kids   Exercise: personal trainer 3/week, exercises on own 2/week   Not working    Family History  Problem Relation Age of Onset  . Hypertension Mother   . Cancer Father        Prostate  . Hypertension Sister   . Diabetes Cousin     Review of Systems  Constitutional: Positive for appetite change (sometimes not good).  Respiratory: Positive for shortness of breath (with anxiety).   Cardiovascular: Negative for chest pain, palpitations and leg swelling.    Neurological: Negative for light-headedness and headaches.  Psychiatric/Behavioral: Positive for dysphoric mood and sleep disturbance. Negative for suicidal ideas. The patient is nervous/anxious.        Moody, irritable       Objective:   Vitals:   11/30/18 1056  BP: (!) 178/98  Pulse: (!) 115  Resp: 16  Temp: 98.7 F (37.1 C)  SpO2: 99%   BP Readings from Last 3 Encounters:  11/30/18 (!) 178/98  11/09/17 (!) 162/114  10/13/17 (!) 162/105   Wt Readings from Last 3 Encounters:  11/30/18 132 lb (59.9 kg)  11/09/17 129 lb (58.5 kg)  10/13/17 134 lb (60.8 kg)   Body mass index is 23.38 kg/m.   Physical Exam    Constitutional: Appears well-developed and well-nourished. No distress.  HENT:  Head: Normocephalic and atraumatic.  Neck: Neck supple. No tracheal deviation present. No thyromegaly present.  No cervical lymphadenopathy Cardiovascular: Tachycardic, regular rhythm and normal heart sounds.   1/6 systolic murmur heard. No carotid bruit .  No edema Pulmonary/Chest: Effort normal and breath sounds normal. No respiratory distress. No has no wheezes. No rales.  Skin: Skin is warm and dry. Not diaphoretic.  Psychiatric: Mildly anxious mood and affect. Behavior is normal.      Assessment & Plan:    See Problem List for Assessment and Plan of chronic medical problems.

## 2018-11-30 ENCOUNTER — Ambulatory Visit: Payer: 59 | Admitting: Internal Medicine

## 2018-11-30 ENCOUNTER — Encounter: Payer: Self-pay | Admitting: Internal Medicine

## 2018-11-30 VITALS — BP 178/98 | HR 115 | Temp 98.7°F | Resp 16 | Ht 63.0 in | Wt 132.0 lb

## 2018-11-30 DIAGNOSIS — R030 Elevated blood-pressure reading, without diagnosis of hypertension: Secondary | ICD-10-CM | POA: Diagnosis not present

## 2018-11-30 DIAGNOSIS — F410 Panic disorder [episodic paroxysmal anxiety] without agoraphobia: Secondary | ICD-10-CM | POA: Diagnosis not present

## 2018-11-30 DIAGNOSIS — R011 Cardiac murmur, unspecified: Secondary | ICD-10-CM

## 2018-11-30 MED ORDER — PAROXETINE HCL 20 MG PO TABS: 20.0000 mg | ORAL_TABLET | Freq: Every day | ORAL | 1 refills | Status: DC

## 2018-11-30 MED ORDER — ALPRAZOLAM 0.5 MG PO TABS: 0.2500 mg | ORAL_TABLET | Freq: Every day | ORAL | 1 refills | Status: DC | PRN

## 2018-11-30 NOTE — Assessment & Plan Note (Signed)
Blood pressure has been consistently elevated. She monitors it at home and her blood pressure is 120s/80s consistently She has been on medication in the past I am concerned that her blood pressure cuff at home is not accurate and she truly has hypertension and needs to be on medication She will continue to monitor at home She will bring her blood pressure cuff in with her to her next visit Will check an echocardiogram given very mild murmur and to evaluate for hypertensive heart disease

## 2018-11-30 NOTE — Assessment & Plan Note (Signed)
1/6 systolic murmur Concern the blood pressure is not ideally controlled Will check echocardiogram to evaluate murmur and hypertensive heart disease

## 2018-11-30 NOTE — Assessment & Plan Note (Signed)
Having increased anxiety, mild depression and panic symptoms Following with a therapist, which she will continue Doing deep breathing and exercising regularly Wants to restart medication Restart Paxil 20 mg daily Xanax as needed-she will only take this as needed and avoid it on a regular basis Follow-up in approximately 2 months

## 2018-11-30 NOTE — Patient Instructions (Addendum)
An echo was ordered  - they will call you to schedule.  Continue to monitor your BP at home.  Bring your cuff to your next appointment.   Medications reviewed and updated.  Changes include :   Restart paxil and xanax as needed.   Your prescription(s) have been submitted to your pharmacy. Please take as directed and contact our office if you believe you are having problem(s) with the medication(s).    Please followup in 8 weeks

## 2018-12-05 ENCOUNTER — Other Ambulatory Visit (HOSPITAL_COMMUNITY): Payer: 59

## 2018-12-07 ENCOUNTER — Ambulatory Visit (HOSPITAL_COMMUNITY): Payer: 59 | Attending: Cardiology

## 2018-12-07 DIAGNOSIS — R030 Elevated blood-pressure reading, without diagnosis of hypertension: Secondary | ICD-10-CM | POA: Diagnosis not present

## 2018-12-07 DIAGNOSIS — R011 Cardiac murmur, unspecified: Secondary | ICD-10-CM | POA: Insufficient documentation

## 2018-12-09 ENCOUNTER — Encounter: Payer: Self-pay | Admitting: Internal Medicine

## 2018-12-11 MED ORDER — ESCITALOPRAM OXALATE 10 MG PO TABS: 10.0000 mg | ORAL_TABLET | Freq: Every day | ORAL | 5 refills | Status: DC

## 2019-01-02 ENCOUNTER — Other Ambulatory Visit: Payer: Self-pay | Admitting: Internal Medicine

## 2019-01-25 ENCOUNTER — Ambulatory Visit: Payer: 59 | Admitting: Internal Medicine

## 2019-02-03 NOTE — Progress Notes (Signed)
Virtual Visit via Video Note  I connected with [name] on 02/03/19 at  9:00 AM EDT by a video enabled telemedicine application and verified that I am speaking with the correct person using two identifiers.   I discussed the limitations of evaluation and management by telemedicine and the availability of in person appointments. The patient expressed understanding and agreed to proceed.  The patient is currently at home and I am in the office.    No referring provider.    History of Present Illness: She is here for follow up of her chronic medical conditions.   Elevated blood pressure:  She has not been monitoring her BP at home.  Because of the increased anxiety during this time she was afraid to start the blood pressure, but she has not had any symptoms to suggest an elevated blood pressure.  She is exercising regularly.  She denies chest pain, palpitations, edema, headaches or lightheadedness.  Anxiety: She is taking her Lexapro daily as prescribed.  She takes half of the Xanax only on occasion.  She denies any side effects from the medications. She feels her anxiety is overall controlled.  There is some increased anxiety because of the coronavirus situation, but she does feel her anxiety is controlled and she is happy with her current dose of medication.  Yeast infection: She thinks she has a yeast infection.  She has a vaginal discharge that is nonodorous, thick and white.  She also has itching.  She denies any fevers, pain with urination, blood in the urine or increased urinary frequency.  She overall feels well and has no other concerns or questions.   Observations/Objective: Appears well in NAD  Has not been checking BP  Assessment and Plan:  See Problem List for Assessment and Plan of chronic medical problems.   Follow Up Instructions:    I discussed the assessment and treatment plan with the patient. The patient was provided an opportunity to ask questions and all  were answered. The patient agreed with the plan and demonstrated an understanding of the instructions.   The patient was advised to call back or seek an in-person evaluation if the symptoms worsen or if the condition fails to improve as anticipated.  Follow up in about in 6 months  Pincus Sanes, MD

## 2019-02-04 ENCOUNTER — Encounter: Payer: Self-pay | Admitting: Internal Medicine

## 2019-02-04 ENCOUNTER — Ambulatory Visit (INDEPENDENT_AMBULATORY_CARE_PROVIDER_SITE_OTHER): Payer: 59 | Admitting: Internal Medicine

## 2019-02-04 DIAGNOSIS — F410 Panic disorder [episodic paroxysmal anxiety] without agoraphobia: Secondary | ICD-10-CM

## 2019-02-04 DIAGNOSIS — R030 Elevated blood-pressure reading, without diagnosis of hypertension: Secondary | ICD-10-CM

## 2019-02-04 DIAGNOSIS — B3731 Acute candidiasis of vulva and vagina: Secondary | ICD-10-CM

## 2019-02-04 DIAGNOSIS — B373 Candidiasis of vulva and vagina: Secondary | ICD-10-CM | POA: Diagnosis not present

## 2019-02-04 MED ORDER — FLUCONAZOLE 150 MG PO TABS: 150.0000 mg | ORAL_TABLET | Freq: Once | ORAL | 0 refills | Status: AC

## 2019-02-04 NOTE — Assessment & Plan Note (Signed)
BP Readings from Last 3 Encounters:  11/30/18 (!) 178/98  11/09/17 (!) 162/114  10/13/17 (!) 162/105   Blood pressure has been elevated here in the office, but has been controlled at home.  She has not been checking it at home because of the current coronavirus situation and overall increased anxiety Advised her that once everything has settled to start checking her blood pressure at home to confirm it is well controlled

## 2019-02-04 NOTE — Assessment & Plan Note (Signed)
Improved and overall controlled Continue Lexapro 10 mg daily Continue alprazolam only as needed-she only takes this on occasion  No refills needed today Follow-up in 6 months, sooner if needed

## 2019-02-04 NOTE — Assessment & Plan Note (Signed)
Her symptoms are consistent with a vaginal yeast infection, which she has had in the past Diflucan 150 mg x 1, may repeat x1 in 72 hours if needed

## 2019-02-19 ENCOUNTER — Encounter: Payer: Self-pay | Admitting: Internal Medicine

## 2019-02-19 ENCOUNTER — Ambulatory Visit (INDEPENDENT_AMBULATORY_CARE_PROVIDER_SITE_OTHER): Payer: 59 | Admitting: Internal Medicine

## 2019-02-19 DIAGNOSIS — N3000 Acute cystitis without hematuria: Secondary | ICD-10-CM

## 2019-02-19 MED ORDER — SULFAMETHOXAZOLE-TRIMETHOPRIM 800-160 MG PO TABS: 1.0000 | ORAL_TABLET | Freq: Two times a day (BID) | ORAL | 0 refills | Status: DC

## 2019-02-19 MED ORDER — FLUCONAZOLE 150 MG PO TABS: 150.0000 mg | ORAL_TABLET | Freq: Once | ORAL | 0 refills | Status: AC

## 2019-02-19 NOTE — Assessment & Plan Note (Signed)
Symptoms consistent with probable UTI Due to the coronavirus situation we will treat empirically Start Bactrim DS twice daily x5 days Diflucan if needed after antibiotic for possible yeast infection Continue increase water  If no improvement she will call.  She will call with any questions or concerns.

## 2019-02-19 NOTE — Progress Notes (Signed)
Virtual Visit via Video Note  I connected with [name] on 02/19/19 at  1:00 PM EDT by a video enabled telemedicine application and verified that I am speaking with the correct person using two identifiers.   I discussed the limitations of evaluation and management by telemedicine and the availability of in person appointments. The patient expressed understanding and agreed to proceed.  The patient is currently at home and I am in the office.    No referring provider.    History of Present Illness: This is an acute visit for possible UTI.  Her symptoms started at least a couple of days ago, but may have been before that.  She states lower back pain, mild dysuria especially yesterday, increased frequency and only going a small amount.  She states there is a slight urine odor, which does resolve when she increases her water intake.  She has had some lower abdominal cramping, but denies any nausea, fevers or blood in urine.  She does have a history of urinary tract infections and the symptoms are consistent with her previous infections.  Review of Systems  Constitutional: Negative for fever.  Gastrointestinal: Positive for abdominal pain (cramping). Negative for nausea.  Genitourinary: Positive for dysuria (yesterday), frequency and urgency. Negative for hematuria.       Abnormal urine odor  Musculoskeletal: Positive for back pain.  Neurological: Positive for headaches.     Social History   Socioeconomic History  . Marital status: Married    Spouse name: Not on file  . Number of children: Not on file  . Years of education: Not on file  . Highest education level: Not on file  Occupational History  . Not on file  Social Needs  . Financial resource strain: Not on file  . Food insecurity:    Worry: Not on file    Inability: Not on file  . Transportation needs:    Medical: Not on file    Non-medical: Not on file  Tobacco Use  . Smoking status: Never Smoker  . Smokeless  tobacco: Never Used  . Tobacco comment: Married, lives with spouse and 3 children ; [place]e Ha and [place].   Substance and Sexual Activity  . Alcohol use: No  . Drug use: No  . Sexual activity: Not on file  Lifestyle  . Physical activity:    Days per week: Not on file    Minutes per session: Not on file  . Stress: Not on file  Relationships  . Social connections:    Talks on phone: Not on file    Gets together: Not on file    Attends religious service: Not on file    Active member of club or organization: Not on file    Attends meetings of clubs or organizations: Not on file    Relationship status: Not on file  Other Topics Concern  . Not on file  Social History Narrative   Married, 3 kids   Exercise: personal trainer 3/week, exercises on own 2/week   Not working     Observations/Objective: Appears well in NAD   Assessment and Plan:  See Problem List for Assessment and Plan of chronic medical problems.   Follow Up Instructions:    I discussed the assessment and treatment plan with the patient. The patient was provided an opportunity to ask questions and all were answered. The patient agreed with the plan and demonstrated an understanding of the instructions.   The patient was advised to call back or  seek an in-person evaluation if the symptoms worsen or if the condition fails to improve as anticipated.    Binnie Rail, MD

## 2019-05-23 DIAGNOSIS — F3281 Premenstrual dysphoric disorder: Secondary | ICD-10-CM | POA: Insufficient documentation

## 2019-05-23 NOTE — Progress Notes (Signed)
Virtual Visit via Video Note  I connected with [name] on 05/24/19 at  9:00 AM EDT by a video enabled telemedicine application and verified that I am speaking with the correct person using two identifiers.   I discussed the limitations of evaluation and management by telemedicine and the availability of in person appointments. The patient expressed understanding and agreed to proceed.  The patient is currently at home and I am in the office.    No referring provider.    History of Present Illness: This is an acute visit for premenstrual dysphoric disorder.  She has a history of PMDD, but she has not had these symptoms for a while.  For the past couple of months she has had the symptoms and they are getting worse each month.  The last cycle was very painful.  Last month she only had one good week out of the whole month.  The only thing that is different is she is doing less cardio and she may have increased stress.  She is unsure why the symptoms have come back.  She states depression, anxiety, irritability, poor sleep, difficulty concentrating, painful periods with abdominal pain and cramping.  She has been experiencing headaches.  She last saw a gynecologist a year or so ago.  She did not do good on birth control pills because of side effects.  She is taking Lexapro 10 mg daily.    Review of Systems  Constitutional: Negative for fever.  Gastrointestinal: Positive for abdominal pain.  Musculoskeletal: Positive for back pain.  Neurological: Positive for headaches.  Psychiatric/Behavioral: Positive for depression. Negative for suicidal ideas. The patient is nervous/anxious and has insomnia.      Social History   Socioeconomic History  . Marital status: Married    Spouse name: Not on file  . Number of children: Not on file  . Years of education: Not on file  . Highest education level: Not on file  Occupational History  . Not on file  Social Needs  . Financial resource  strain: Not on file  . Food insecurity    Worry: Not on file    Inability: Not on file  . Transportation needs    Medical: Not on file    Non-medical: Not on file  Tobacco Use  . Smoking status: Never Smoker  . Smokeless tobacco: Never Used  . Tobacco comment: Married, lives with spouse and 3 children ; [place] and [place].   Substance and Sexual Activity  . Alcohol use: No  . Drug use: No  . Sexual activity: Not on file  Lifestyle  . Physical activity    Days per week: Not on file    Minutes per session: Not on file  . Stress: Not on file  Relationships  . Social Herbalist on phone: Not on file    Gets together: Not on file    Attends religious service: Not on file    Active member of club or organization: Not on file    Attends meetings of clubs or organizations: Not on file    Relationship status: Not on file  Other Topics Concern  . Not on file  Social History Narrative   Married, 3 kids   Exercise: personal trainer 3/week, exercises on own 2/week   Not working     Observations/Objective: Appears well in NAD   Assessment and Plan:  See Problem List for Assessment and Plan of chronic medical problems.   Follow Up Instructions:  I discussed the assessment and treatment plan with the patient. The patient was provided an opportunity to ask questions and all were answered. The patient agreed with the plan and demonstrated an understanding of the instructions.   The patient was advised to call back or seek an in-person evaluation if the symptoms worsen or if the condition fails to improve as anticipated.    Binnie Rail, MD

## 2019-05-24 ENCOUNTER — Ambulatory Visit (INDEPENDENT_AMBULATORY_CARE_PROVIDER_SITE_OTHER): Payer: 59 | Admitting: Internal Medicine

## 2019-05-24 ENCOUNTER — Encounter: Payer: Self-pay | Admitting: Internal Medicine

## 2019-05-24 DIAGNOSIS — F3281 Premenstrual dysphoric disorder: Secondary | ICD-10-CM | POA: Diagnosis not present

## 2019-05-24 DIAGNOSIS — F410 Panic disorder [episodic paroxysmal anxiety] without agoraphobia: Secondary | ICD-10-CM

## 2019-05-24 MED ORDER — ESCITALOPRAM OXALATE 20 MG PO TABS: 20.0000 mg | ORAL_TABLET | Freq: Every day | ORAL | 5 refills | Status: DC

## 2019-05-24 NOTE — Assessment & Plan Note (Addendum)
Not controlled She does have increased stress and has been experiencing increased anxiety, which is likely partially related to her PMDD and may be making it worse Will increase Lexapro to 20 mg daily She is seeing a therapist and will continue

## 2019-05-24 NOTE — Assessment & Plan Note (Signed)
She has had this in the past and this restarted a couple of months Will increase lexapro to 20 mg daily She will see gyn to consider birth control  Continue increased exercise Continue to work on stress reduction

## 2019-06-07 ENCOUNTER — Other Ambulatory Visit: Payer: Self-pay | Admitting: Internal Medicine

## 2019-06-23 ENCOUNTER — Encounter: Payer: Self-pay | Admitting: Internal Medicine

## 2019-10-15 ENCOUNTER — Other Ambulatory Visit: Payer: Self-pay | Admitting: Internal Medicine

## 2019-10-20 ENCOUNTER — Other Ambulatory Visit: Payer: Self-pay | Admitting: Internal Medicine

## 2019-10-21 MED ORDER — ESCITALOPRAM OXALATE 20 MG PO TABS: 20.0000 mg | ORAL_TABLET | Freq: Every day | ORAL | 0 refills | Status: DC

## 2019-12-03 DIAGNOSIS — G4700 Insomnia, unspecified: Secondary | ICD-10-CM | POA: Insufficient documentation

## 2019-12-03 DIAGNOSIS — R635 Abnormal weight gain: Secondary | ICD-10-CM | POA: Insufficient documentation

## 2019-12-03 NOTE — Progress Notes (Signed)
Virtual Visit via Video Note  I connected with [name] on 12/04/19 at  9:30 AM EST by a video enabled telemedicine application and verified that I am speaking with the correct person using two identifiers.   I discussed the limitations of evaluation and management by telemedicine and the availability of in person appointments. The patient expressed understanding and agreed to proceed.  Present for the visit:  Myself, Dr Cheryll Cockayne, [name].  The patient is currently at home and I am in the office.    No referring provider.    History of Present Illness: This is an acute visit for insomnia, weight gain and anxiety.  She is gaining weight and feels it is the stress.   It started last year.    She took the lexapro last year and she does not think it helped.  It made her more anxious.  She stopped taking it in November.    She is not sleeping  - she can not fall asleep until 3 am.    She is still exercising.   She is not eating too much and most of the time feels she is undereating.  She is going Vegan and has done that the past 2-3 weeks.  Recently she has noticed that she is trying to lose weight.  She is still in therapy.  She feels she needs some for anxiety and insomnia.   She is only taking supplements at this time - a stress relief, magnesium, vitamin B12  She ideally does not want to be on anything this can cause weight gain.  She admits there may be a little depression, but anxiety is the biggest concern.  She has not been monitoring her blood pressure at home and she knows she needs to.   Review of Systems  Cardiovascular: Negative for chest pain and palpitations.  Neurological: Negative for dizziness and headaches.  Psychiatric/Behavioral: Positive for depression. The patient is nervous/anxious and has insomnia.      Social History   Socioeconomic History  . Marital status: Married    Spouse name: Not on file  . Number of children: Not on file   . Years of education: Not on file  . Highest education level: Not on file  Occupational History  . Not on file  Tobacco Use  . Smoking status: Never Smoker  . Smokeless tobacco: Never Used  . Tobacco comment: Married, lives with spouse and 3 children ; Christiane Ha and [place].   Substance and Sexual Activity  . Alcohol use: No  . Drug use: No  . Sexual activity: Not on file  Other Topics Concern  . Not on file  Social History Narrative   Married, 3 kids   Exercise: Systems analyst 3/week, exercises on own 2/week   Not working   Social Determinants of Health   Financial Resource Strain:   . Difficulty of Paying Living Expenses: Not on file  Food Insecurity:   . Worried About Programme researcher, broadcasting/film/video in the Last Year: Not on file  . Ran Out of Food in the Last Year: Not on file  Transportation Needs:   . Lack of Transportation (Medical): Not on file  . Lack of Transportation (Non-Medical): Not on file  Physical Activity:   . Days of Exercise per Week: Not on file  . Minutes of Exercise per Session: Not on file  Stress:   . Feeling of Stress : Not on file  Social Connections:   . Frequency of Communication  with Friends and Family: Not on file  . Frequency of Social Gatherings with Friends and Family: Not on file  . Attends Religious Services: Not on file  . Active Member of Clubs or Organizations: Not on file  . Attends Archivist Meetings: Not on file  . Marital Status: Not on file     Observations/Objective: Appears well in NAD Breathing normally Slightly anxious mood and affect  Assessment and Plan:  See Problem List for Assessment and Plan of chronic medical problems.   Follow Up Instructions:    I discussed the assessment and treatment plan with the patient. The patient was provided an opportunity to ask questions and all were answered. The patient agreed with the plan and demonstrated an understanding of the instructions.   The patient was advised to  call back or seek an in-person evaluation if the symptoms worsen or if the condition fails to improve as anticipated.    Binnie Rail, MD

## 2019-12-04 ENCOUNTER — Ambulatory Visit (INDEPENDENT_AMBULATORY_CARE_PROVIDER_SITE_OTHER): Payer: 59 | Admitting: Internal Medicine

## 2019-12-04 ENCOUNTER — Encounter: Payer: Self-pay | Admitting: Internal Medicine

## 2019-12-04 DIAGNOSIS — F410 Panic disorder [episodic paroxysmal anxiety] without agoraphobia: Secondary | ICD-10-CM

## 2019-12-04 DIAGNOSIS — I10 Essential (primary) hypertension: Secondary | ICD-10-CM

## 2019-12-04 DIAGNOSIS — G4709 Other insomnia: Secondary | ICD-10-CM | POA: Diagnosis not present

## 2019-12-04 DIAGNOSIS — R635 Abnormal weight gain: Secondary | ICD-10-CM

## 2019-12-04 DIAGNOSIS — F3281 Premenstrual dysphoric disorder: Secondary | ICD-10-CM

## 2019-12-04 MED ORDER — BUSPIRONE HCL 5 MG PO TABS: 5.0000 mg | ORAL_TABLET | Freq: Three times a day (TID) | ORAL | 3 refills | Status: DC

## 2019-12-04 MED ORDER — TRAZODONE HCL 50 MG PO TABS: 25.0000 mg | ORAL_TABLET | Freq: Every evening | ORAL | 3 refills | Status: DC | PRN

## 2019-12-04 NOTE — Assessment & Plan Note (Signed)
New She is having significant difficulty with sleep She has tried natural remedies and they have not helped Her weight loss efforts and anxiety would likely be better for sleep is better She does not want to take anything long-term Will try trazodone 25-50 mg at bedtime If this is not effective may need to try something like Lunesta for short-term She will update me via MyChart

## 2019-12-04 NOTE — Assessment & Plan Note (Signed)
Still experiencing symptoms of PMDD Has not seen her gynecologist yet-stressed follow-up because birth control may significantly help her symptoms Discussed that a low-dose SSRI may also help, but is more likely to cause weight gain and she would like to avoid that We will try buspirone for anxiety

## 2019-12-04 NOTE — Assessment & Plan Note (Signed)
Chronic Anxiety not ideally controlled She stopped taking Lexapro, but did not feel that that it worked She knows she needs medication for anxiety, but hopes that it is short-term She would ideally like nothing that causes any weight gain Trial of buspirone-5 mg 3 times daily hopefully this will work, but if not we may need to consider something that potentially can cause weight gain She will let me know if there are any side effects or if the medication is working or not

## 2019-12-04 NOTE — Assessment & Plan Note (Signed)
?    Whitecoat hypertension or true hypertension Stressed that she needs to be monitoring her blood pressure at home so that we know if she needs medication or not

## 2019-12-06 ENCOUNTER — Encounter: Payer: Self-pay | Admitting: Internal Medicine

## 2019-12-08 MED ORDER — LOSARTAN POTASSIUM 25 MG PO TABS: 25.0000 mg | ORAL_TABLET | Freq: Every day | ORAL | 5 refills | Status: DC

## 2019-12-25 ENCOUNTER — Encounter: Payer: Self-pay | Admitting: Internal Medicine

## 2019-12-28 MED ORDER — LOSARTAN POTASSIUM 50 MG PO TABS: 50.0000 mg | ORAL_TABLET | Freq: Every day | ORAL | 3 refills | Status: DC

## 2020-01-05 ENCOUNTER — Encounter: Payer: Self-pay | Admitting: Internal Medicine

## 2020-01-05 DIAGNOSIS — Z833 Family history of diabetes mellitus: Secondary | ICD-10-CM | POA: Insufficient documentation

## 2020-01-05 NOTE — Patient Instructions (Addendum)
Blood work was ordered.    All other Health Maintenance issues reviewed.   All recommended immunizations and age-appropriate screenings are up-to-date or discussed.  No immunization administered today.   Medications reviewed and updated.  Changes include :     Your prescription(s) have been submitted to your pharmacy. Please take as directed and contact our office if you believe you are having problem(s) with the medication(s).  A referral was ordered for    Please followup in 6 months    Health Maintenance, Female Adopting a healthy lifestyle and getting preventive care are important in promoting health and wellness. Ask your health care provider about:  The right schedule for you to have regular tests and exams.  Things you can do on your own to prevent diseases and keep yourself healthy. What should I know about diet, weight, and exercise? Eat a healthy diet   Eat a diet that includes plenty of vegetables, fruits, low-fat dairy products, and lean protein.  Do not eat a lot of foods that are high in solid fats, added sugars, or sodium. Maintain a healthy weight Body mass index (BMI) is used to identify weight problems. It estimates body fat based on height and weight. Your health care provider can help determine your BMI and help you achieve or maintain a healthy weight. Get regular exercise Get regular exercise. This is one of the most important things you can do for your health. Most adults should:  Exercise for at least 150 minutes each week. The exercise should increase your heart rate and make you sweat (moderate-intensity exercise).  Do strengthening exercises at least twice a week. This is in addition to the moderate-intensity exercise.  Spend less time sitting. Even light physical activity can be beneficial. Watch cholesterol and blood lipids Have your blood tested for lipids and cholesterol at 42 years of age, then have this test every 5 years. Have your  cholesterol levels checked more often if:  Your lipid or cholesterol levels are high.  You are older than 41 years of age.  You are at high risk for heart disease. What should I know about cancer screening? Depending on your health history and family history, you may need to have cancer screening at various ages. This may include screening for:  Breast cancer.  Cervical cancer.  Colorectal cancer.  Skin cancer.  Lung cancer. What should I know about heart disease, diabetes, and high blood pressure? Blood pressure and heart disease  High blood pressure causes heart disease and increases the risk of stroke. This is more likely to develop in people who have high blood pressure readings, are of African descent, or are overweight.  Have your blood pressure checked: ? Every 3-5 years if you are 60-36 years of age. ? Every year if you are 37 years old or older. Diabetes Have regular diabetes screenings. This checks your fasting blood sugar level. Have the screening done:  Once every three years after age 37 if you are at a normal weight and have a low risk for diabetes.  More often and at a younger age if you are overweight or have a high risk for diabetes. What should I know about preventing infection? Hepatitis B If you have a higher risk for hepatitis B, you should be screened for this virus. Talk with your health care provider to find out if you are at risk for hepatitis B infection. Hepatitis C Testing is recommended for:  Everyone born from [birth year] through 1963.  Anyone  with known risk factors for hepatitis C. Sexually transmitted infections (STIs)  Get screened for STIs, including gonorrhea and chlamydia, if: ? You are sexually active and are younger than 42 years of age. ? You are older than 42 years of age and your health care provider tells you that you are at risk for this type of infection. ? Your sexual activity has changed since you were last screened, and you are  at increased risk for chlamydia or gonorrhea. Ask your health care provider if you are at risk.  Ask your health care provider about whether you are at high risk for HIV. Your health care provider may recommend a prescription medicine to help prevent HIV infection. If you choose to take medicine to prevent HIV, you should first get tested for HIV. You should then be tested every 3 months for as long as you are taking the medicine. Pregnancy  If you are about to stop having your period (premenopausal) and you may become pregnant, seek counseling before you get pregnant.  Take 400 to 800 micrograms (mcg) of folic acid every day if you become pregnant.  Ask for birth control (contraception) if you want to prevent pregnancy. Osteoporosis and menopause Osteoporosis is a disease in which the bones lose minerals and strength with aging. This can result in bone fractures. If you are 64 years old or older, or if you are at risk for osteoporosis and fractures, ask your health care provider if you should:  Be screened for bone loss.  Take a calcium or vitamin D supplement to lower your risk of fractures.  Be given hormone replacement therapy (HRT) to treat symptoms of menopause. Follow these instructions at home: Lifestyle  Do not use any products that contain nicotine or tobacco, such as cigarettes, e-cigarettes, and chewing tobacco. If you need help quitting, ask your health care provider.  Do not use street drugs.  Do not share needles.  Ask your health care provider for help if you need support or information about quitting drugs. Alcohol use  Do not drink alcohol if: ? Your health care provider tells you not to drink. ? You are pregnant, may be pregnant, or are planning to become pregnant.  If you drink alcohol: ? Limit how much you use to 0-1 drink a day. ? Limit intake if you are breastfeeding.  Be aware of how much alcohol is in your drink. In the [place] one drink equals one 12 oz  bottle of beer (355 mL), one 5 oz glass of wine (148 mL), or one 1 oz glass of hard liquor (44 mL). General instructions  Schedule regular health, dental, and eye exams.  Stay current with your vaccines.  Tell your health care provider if: ? You often feel depressed. ? You have ever been abused or do not feel safe at home. Summary  Adopting a healthy lifestyle and getting preventive care are important in promoting health and wellness.  Follow your health care provider's instructions about healthy diet, exercising, and getting tested or screened for diseases.  Follow your health care provider's instructions on monitoring your cholesterol and blood pressure. This information is not intended to replace advice given to you by your health care provider. Make sure you discuss any questions you have with your health care provider. Document Revised: 10/10/2018 Document Reviewed: 10/10/2018 Elsevier Patient Education  2020 [employer].

## 2020-01-05 NOTE — Progress Notes (Signed)
Subjective:    Patient ID: [name], female    DOB: Dec 11, 1975, 44 y.o.   MRN: [id number]  HPI She is here for a physical exam.     Medications and allergies reviewed with patient and updated if appropriate.  Patient Active Problem List   Diagnosis Date Noted  . Insomnia 12/03/2019  . Weight gain 12/03/2019  . PMDD (premenstrual dysphoric disorder) 05/23/2019  . Murmur, cardiac 11/30/2018  . Elevated blood pressure reading 11/30/2018  . Non-celiac gluten sensitivity 11/09/2017  . Panic anxiety syndrome 09/25/2013  . LIBIDO, DECREASED 09/29/2010  . Allergic rhinitis 03/25/2010  . HYPERTENSION, BENIGN SYSTEMIC 12/28/2006  . OVARIAN CYST 12/28/2006    Current Outpatient Medications on File Prior to Visit  Medication Sig Dispense Refill  . busPIRone (BUSPAR) 5 MG tablet Take 1 tablet (5 mg total) by mouth 3 (three) times daily. 90 tablet 3  . Cyanocobalamin (B-12) 1000 MCG CAPS Takes one daily    . losartan (COZAAR) 50 MG tablet Take 1 tablet (50 mg total) by mouth daily. 90 tablet 3  . traZODone (DESYREL) 50 MG tablet Take 0.5-1 tablets (25-50 mg total) by mouth at bedtime as needed for sleep. 30 tablet 3   No current facility-administered medications on file prior to visit.    Past Medical History:  Diagnosis Date  . ALLERGIC RHINITIS   . HYPERTENSION   . OVARIAN CYST   . Unspecified Anemia     Past Surgical History:  Procedure Laterality Date  . NO PAST SURGERIES      Social History   Socioeconomic History  . Marital status: Married    Spouse name: Not on file  . Number of children: Not on file  . Years of education: Not on file  . Highest education level: Not on file  Occupational History  . Not on file  Tobacco Use  . Smoking status: Never Smoker  . Smokeless tobacco: Never Used  . Tobacco comment: Married, lives with spouse and 3 children ; [place]e [place] and [place].   Substance and Sexual Activity  . Alcohol use: No  . Drug use: No  .  Sexual activity: Not on file  Other Topics Concern  . Not on file  Social History Narrative   Married, 3 kids   Exercise: Systems analyst 3/week, exercises on own 2/week   Not working   Social Determinants of Health   Financial Resource Strain:   . Difficulty of Paying Living Expenses: Not on file  Food Insecurity:   . Worried About Programme researcher, broadcasting/film/video in the Last Year: Not on file  . Ran Out of Food in the Last Year: Not on file  Transportation Needs:   . Lack of Transportation (Medical): Not on file  . Lack of Transportation (Non-Medical): Not on file  Physical Activity:   . Days of Exercise per Week: Not on file  . Minutes of Exercise per Session: Not on file  Stress:   . Feeling of Stress : Not on file  Social Connections:   . Frequency of Communication with Friends and Family: Not on file  . Frequency of Social Gatherings with Friends and Family: Not on file  . Attends Religious Services: Not on file  . Active Member of Clubs or Organizations: Not on file  . Attends Banker Meetings: Not on file  . Marital Status: Not on file    Family History  Problem Relation Age of Onset  . Hypertension Mother   .  Cancer Father        Prostate  . Hypertension Sister   . Diabetes Cousin     Review of Systems     Objective:  There were no vitals filed for this visit. There were no vitals filed for this visit. There is no height or weight on file to calculate BMI.  BP Readings from Last 3 Encounters:  11/30/18 (!) 178/98  11/09/17 (!) 162/114  10/13/17 (!) 162/105    Wt Readings from Last 3 Encounters:  11/30/18 132 lb (59.9 kg)  11/09/17 129 lb (58.5 kg)  10/13/17 134 lb (60.8 kg)     Physical Exam        Assessment & Plan:   Physical exam:   See Problem List for Assessment and Plan of chronic medical problems.     This visit occurred during the SARS-CoV-2 public health emergency.  Safety protocols were in place, including screening  questions prior to the visit, additional usage of staff PPE, and extensive cleaning of exam room while observing appropriate contact time as indicated for disinfecting solutions.     This encounter was created in error - please disregard.

## 2020-01-07 ENCOUNTER — Encounter: Payer: 59 | Admitting: Internal Medicine

## 2020-01-07 ENCOUNTER — Encounter: Payer: Self-pay | Admitting: Internal Medicine

## 2020-01-13 NOTE — Progress Notes (Signed)
Subjective:    Patient ID: [name], female    DOB: 12-04-1975, 44 y.o.   MRN: [id number]  HPI She is here for a physical exam.   She does not like the buspar - it makes her feel funny, especially if she does not take it exactly when it is due.     Her BP at home - yest am - 129/85 (L), 124/89 (R)    When anxious 145/100   131/92 on 3/13,  116/87     She is concerned about weight gain.  She has to make herself eat sometimes due to no appetite.  She does not eat fried food or fast food.  She eats gluten free.  She thinks she eats healthy overall.  She does not understand why she has gained so much weight.   Medications and allergies reviewed with patient and updated if appropriate.  Patient Active Problem List   Diagnosis Date Noted  . History of iron deficiency 01/14/2020  . Family history of diabetes mellitus (DM) 01/05/2020  . Insomnia 12/03/2019  . Weight gain 12/03/2019  . PMDD (premenstrual dysphoric disorder) 05/23/2019  . Murmur, cardiac 11/30/2018  . Non-celiac gluten sensitivity 11/09/2017  . Panic anxiety syndrome 09/25/2013  . LIBIDO, DECREASED 09/29/2010  . Allergic rhinitis 03/25/2010  . HYPERTENSION, BENIGN SYSTEMIC 12/28/2006  . OVARIAN CYST 12/28/2006    Current Outpatient Medications on File Prior to Visit  Medication Sig Dispense Refill  . ASHWAGANDHA PO Take by mouth.    . Cyanocobalamin (B-12) 1000 MCG CAPS Takes one daily    . Ferrous Sulfate (IRON PO) Take by mouth.    . losartan (COZAAR) 50 MG tablet Take 1 tablet (50 mg total) by mouth daily. 90 tablet 3  . Magnesium 300 MG CAPS Take by mouth.    . traZODone (DESYREL) 50 MG tablet Take 0.5-1 tablets (25-50 mg total) by mouth at bedtime as needed for sleep. 30 tablet 3   No current facility-administered medications on file prior to visit.    Past Medical History:  Diagnosis Date  . ALLERGIC RHINITIS   . HYPERTENSION   . OVARIAN CYST   . Unspecified Anemia     Past Surgical  History:  Procedure Laterality Date  . NO PAST SURGERIES      Social History   Socioeconomic History  . Marital status: Married    Spouse name: Not on file  . Number of children: Not on file  . Years of education: Not on file  . Highest education level: Not on file  Occupational History  . Not on file  Tobacco Use  . Smoking status: Never Smoker  . Smokeless tobacco: Never Used  . Tobacco comment: Married, lives with spouse and 3 children ; [place] and [place].   Substance and Sexual Activity  . Alcohol use: No  . Drug use: No  . Sexual activity: Not on file  Other Topics Concern  . Not on file  Social History Narrative   Married, 3 kids   Exercise: Physiological scientist 3/week, exercises on own 2/week   Not working   Social Determinants of Radio broadcast assistant Strain:   . Difficulty of Paying Living Expenses:   Food Insecurity:   . Worried About Charity fundraiser in the Last Year:   . Arboriculturist in the Last Year:   Transportation Needs:   . Film/video editor (Medical):   Marland Kitchen Lack of Transportation (Non-Medical):   Physical  Activity:   . Days of Exercise per Week:   . Minutes of Exercise per Session:   Stress:   . Feeling of Stress :   Social Connections:   . Frequency of Communication with Friends and Family:   . Frequency of Social Gatherings with Friends and Family:   . Attends Religious Services:   . Active Member of Clubs or Organizations:   . Attends Banker Meetings:   Marland Kitchen Marital Status:     Family History  Problem Relation Age of Onset  . Hypertension Mother   . Hyperlipidemia Mother   . Diabetes Mother   . Cancer Father        Prostate  . Hypertension Sister   . Diabetes Cousin     Review of Systems  Constitutional: Negative for chills, fatigue (tired but wired at times) and fever.  Eyes: Negative for visual disturbance.  Respiratory: Positive for cough (allergy related). Negative for shortness of breath and  wheezing.   Cardiovascular: Positive for palpitations (with anxiety). Negative for chest pain and leg swelling.  Gastrointestinal: Positive for constipation. Negative for abdominal pain, blood in stool, diarrhea and nausea.       No gerd  Endocrine: Positive for cold intolerance (at times).  Genitourinary: Negative for dysuria and hematuria.  Musculoskeletal: Negative for arthralgias and back pain.  Skin: Negative for color change and rash.       Itching after working out - improved with allergy med - has not had a in a while  Neurological: Positive for dizziness (related to menses). Negative for light-headedness and headaches.  Psychiatric/Behavioral: Positive for dysphoric mood and sleep disturbance. The patient is nervous/anxious.        Objective:   Vitals:   01/14/20 1006  BP: (!) 164/102  Pulse: (!) 112  Resp: 16  Temp: 99.2 F (37.3 C)   Filed Weights   01/14/20 1006  Weight: 171 lb 12.8 oz (77.9 kg)   Body mass index is 30.43 kg/m.  BP Readings from Last 3 Encounters:  01/14/20 (!) 164/102  11/30/18 (!) 178/98  11/09/17 (!) 162/114    Wt Readings from Last 3 Encounters:  01/14/20 171 lb 12.8 oz (77.9 kg)  11/30/18 132 lb (59.9 kg)  11/09/17 129 lb (58.5 kg)     Physical Exam Constitutional: She appears well-developed and well-nourished. No distress.  HENT:  Head: Normocephalic and atraumatic.  Right Ear: External ear normal. Normal ear canal and TM Left Ear: External ear normal.  Normal ear canal and TM Mouth/Throat: Oropharynx is clear and moist.  Eyes: Conjunctivae and EOM are normal.  Neck: Neck supple. No tracheal deviation present. No thyromegaly present.  No carotid bruit  Cardiovascular: Normal rate, regular rhythm and normal heart sounds.   No murmur heard.  No edema. Pulmonary/Chest: Effort normal and breath sounds normal. No respiratory distress. She has no wheezes. She has no rales.  Breast: deferred   Abdominal: Soft. She exhibits no  distension. There is no tenderness.  Lymphadenopathy: She has no cervical adenopathy.  Skin: Skin is warm and dry. She is not diaphoretic.  Psychiatric: She has a normal mood and affect. Her behavior is normal.        Assessment & Plan:   Physical exam: Screening blood work    ordered Immunizations  Td up to date, deferred flu Mammogram  scheduled Gyn  Has appointment this month Exercise  regular Weight  Has gained weight - working on weight loss Substance abuse  none  See Problem List for Assessment and Plan of chronic medical problems.    This visit occurred during the SARS-CoV-2 public health emergency.  Safety protocols were in place, including screening questions prior to the visit, additional usage of staff PPE, and extensive cleaning of exam room while observing appropriate contact time as indicated for disinfecting solutions.

## 2020-01-13 NOTE — Patient Instructions (Addendum)
Blood work was ordered.    All other Health Maintenance issues reviewed.   All recommended immunizations and age-appropriate screenings are up-to-date or discussed.  No immunization administered today.   Medications reviewed and updated.  Changes include :   Start effexor 37.5 mg daily  Your prescription(s) have been submitted to your pharmacy. Please take as directed and contact our office if you believe you are having problem(s) with the medication(s).    Please followup in 6 months    Health Maintenance, Female Adopting a healthy lifestyle and getting preventive care are important in promoting health and wellness. Ask your health care provider about:  The right schedule for you to have regular tests and exams.  Things you can do on your own to prevent diseases and keep yourself healthy. What should I know about diet, weight, and exercise? Eat a healthy diet   Eat a diet that includes plenty of vegetables, fruits, low-fat dairy products, and lean protein.  Do not eat a lot of foods that are high in solid fats, added sugars, or sodium. Maintain a healthy weight Body mass index (BMI) is used to identify weight problems. It estimates body fat based on height and weight. Your health care provider can help determine your BMI and help you achieve or maintain a healthy weight. Get regular exercise Get regular exercise. This is one of the most important things you can do for your health. Most adults should:  Exercise for at least 150 minutes each week. The exercise should increase your heart rate and make you sweat (moderate-intensity exercise).  Do strengthening exercises at least twice a week. This is in addition to the moderate-intensity exercise.  Spend less time sitting. Even light physical activity can be beneficial. Watch cholesterol and blood lipids Have your blood tested for lipids and cholesterol at 42 years of age, then have this test every 5 years. Have your  cholesterol levels checked more often if:  Your lipid or cholesterol levels are high.  You are older than 42 years of age.  You are at high risk for heart disease. What should I know about cancer screening? Depending on your health history and family history, you may need to have cancer screening at various ages. This may include screening for:  Breast cancer.  Cervical cancer.  Colorectal cancer.  Skin cancer.  Lung cancer. What should I know about heart disease, diabetes, and high blood pressure? Blood pressure and heart disease  High blood pressure causes heart disease and increases the risk of stroke. This is more likely to develop in people who have high blood pressure readings, are of African descent, or are overweight.  Have your blood pressure checked: ? Every 3-5 years if you are 1-70 years of age. ? Every year if you are 78 years old or older. Diabetes Have regular diabetes screenings. This checks your fasting blood sugar level. Have the screening done:  Once every three years after age 9 if you are at a normal weight and have a low risk for diabetes.  More often and at a younger age if you are overweight or have a high risk for diabetes. What should I know about preventing infection? Hepatitis B If you have a higher risk for hepatitis B, you should be screened for this virus. Talk with your health care provider to find out if you are at risk for hepatitis B infection. Hepatitis C Testing is recommended for:  Everyone born from [birth year] through 1963.  Anyone with known  risk factors for hepatitis C. Sexually transmitted infections (STIs)  Get screened for STIs, including gonorrhea and chlamydia, if: ? You are sexually active and are younger than 42 years of age. ? You are older than 42 years of age and your health care provider tells you that you are at risk for this type of infection. ? Your sexual activity has changed since you were last screened, and you are  at increased risk for chlamydia or gonorrhea. Ask your health care provider if you are at risk.  Ask your health care provider about whether you are at high risk for HIV. Your health care provider may recommend a prescription medicine to help prevent HIV infection. If you choose to take medicine to prevent HIV, you should first get tested for HIV. You should then be tested every 3 months for as long as you are taking the medicine. Pregnancy  If you are about to stop having your period (premenopausal) and you may become pregnant, seek counseling before you get pregnant.  Take 400 to 800 micrograms (mcg) of folic acid every day if you become pregnant.  Ask for birth control (contraception) if you want to prevent pregnancy. Osteoporosis and menopause Osteoporosis is a disease in which the bones lose minerals and strength with aging. This can result in bone fractures. If you are 55 years old or older, or if you are at risk for osteoporosis and fractures, ask your health care provider if you should:  Be screened for bone loss.  Take a calcium or vitamin D supplement to lower your risk of fractures.  Be given hormone replacement therapy (HRT) to treat symptoms of menopause. Follow these instructions at home: Lifestyle  Do not use any products that contain nicotine or tobacco, such as cigarettes, e-cigarettes, and chewing tobacco. If you need help quitting, ask your health care provider.  Do not use street drugs.  Do not share needles.  Ask your health care provider for help if you need support or information about quitting drugs. Alcohol use  Do not drink alcohol if: ? Your health care provider tells you not to drink. ? You are pregnant, may be pregnant, or are planning to become pregnant.  If you drink alcohol: ? Limit how much you use to 0-1 drink a day. ? Limit intake if you are breastfeeding.  Be aware of how much alcohol is in your drink. In the [place] one drink equals one 12 oz  bottle of beer (355 mL), one 5 oz glass of wine (148 mL), or one 1 oz glass of hard liquor (44 mL). General instructions  Schedule regular health, dental, and eye exams.  Stay current with your vaccines.  Tell your health care provider if: ? You often feel depressed. ? You have ever been abused or do not feel safe at home. Summary  Adopting a healthy lifestyle and getting preventive care are important in promoting health and wellness.  Follow your health care provider's instructions about healthy diet, exercising, and getting tested or screened for diseases.  Follow your health care provider's instructions on monitoring your cholesterol and blood pressure. This information is not intended to replace advice given to you by your health care provider. Make sure you discuss any questions you have with your health care provider. Document Revised: 10/10/2018 Document Reviewed: 10/10/2018 Elsevier Patient Education  2020 [employer].

## 2020-01-14 ENCOUNTER — Encounter: Payer: Self-pay | Admitting: Internal Medicine

## 2020-01-14 ENCOUNTER — Other Ambulatory Visit: Payer: Self-pay

## 2020-01-14 ENCOUNTER — Ambulatory Visit (INDEPENDENT_AMBULATORY_CARE_PROVIDER_SITE_OTHER): Payer: 59 | Admitting: Internal Medicine

## 2020-01-14 VITALS — BP 164/102 | HR 112 | Temp 99.2°F | Resp 16 | Ht 63.0 in | Wt 171.8 lb

## 2020-01-14 DIAGNOSIS — G4709 Other insomnia: Secondary | ICD-10-CM | POA: Diagnosis not present

## 2020-01-14 DIAGNOSIS — Z0000 Encounter for general adult medical examination without abnormal findings: Secondary | ICD-10-CM

## 2020-01-14 DIAGNOSIS — F410 Panic disorder [episodic paroxysmal anxiety] without agoraphobia: Secondary | ICD-10-CM | POA: Diagnosis not present

## 2020-01-14 DIAGNOSIS — Z8639 Personal history of other endocrine, nutritional and metabolic disease: Secondary | ICD-10-CM

## 2020-01-14 DIAGNOSIS — I10 Essential (primary) hypertension: Secondary | ICD-10-CM | POA: Diagnosis not present

## 2020-01-14 DIAGNOSIS — Z833 Family history of diabetes mellitus: Secondary | ICD-10-CM

## 2020-01-14 LAB — LIPID PANEL
Cholesterol: 186 mg/dL (ref 0–200)
HDL: 57.5 mg/dL (ref >39.00)
LDL Cholesterol: 115 mg/dL — ABNORMAL HIGH (ref 0–99)
NonHDL: 128.88
Total CHOL/HDL Ratio: 3
Triglycerides: 70 mg/dL (ref 0.0–149.0)
VLDL: 14 mg/dL (ref 0.0–40.0)

## 2020-01-14 LAB — COMPREHENSIVE METABOLIC PANEL
ALT: 12 U/L (ref 0–35)
AST: 13 U/L (ref 0–37)
Albumin: 3.9 g/dL (ref 3.5–5.2)
Alkaline Phosphatase: 67 U/L (ref 39–117)
BUN: 11 mg/dL (ref 6–23)
CO2: 25 mEq/L (ref 19–32)
Calcium: 9 mg/dL (ref 8.4–10.5)
Chloride: 105 mEq/L (ref 96–112)
Creatinine, Ser: 0.83 mg/dL (ref 0.40–1.20)
GFR: 91.55 mL/min (ref >60.00)
Glucose, Bld: 101 mg/dL — ABNORMAL HIGH (ref 70–99)
Potassium: 3.9 mEq/L (ref 3.5–5.1)
Sodium: 136 mEq/L (ref 135–145)
Total Bilirubin: 0.4 mg/dL (ref 0.2–1.2)
Total Protein: 7.1 g/dL (ref 6.0–8.3)

## 2020-01-14 LAB — CBC WITH DIFFERENTIAL/PLATELET
Basophils Absolute: 0 10*3/uL (ref 0.0–0.1)
Basophils Relative: 0.5 % (ref 0.0–3.0)
Eosinophils Absolute: 0.2 10*3/uL (ref 0.0–0.7)
Eosinophils Relative: 2.1 % (ref 0.0–5.0)
HCT: 38.3 % (ref 36.0–46.0)
Hemoglobin: 12.5 g/dL (ref 12.0–15.0)
Lymphocytes Relative: 25 % (ref 12.0–46.0)
Lymphs Abs: 2.3 10*3/uL (ref 0.7–4.0)
MCHC: 32.7 g/dL (ref 30.0–36.0)
MCV: 84.9 fl (ref 78.0–100.0)
Monocytes Absolute: 0.6 10*3/uL (ref 0.1–1.0)
Monocytes Relative: 6.4 % (ref 3.0–12.0)
Neutro Abs: 6 10*3/uL (ref 1.4–7.7)
Neutrophils Relative %: 66 % (ref 43.0–77.0)
Platelets: 251 10*3/uL (ref 150.0–400.0)
RBC: 4.51 Mil/uL (ref 3.87–5.11)
RDW: 14.3 % (ref 11.5–15.5)
WBC: 9.1 10*3/uL (ref 4.0–10.5)

## 2020-01-14 LAB — HEMOGLOBIN A1C: Hgb A1c MFr Bld: 5.7 % (ref 4.6–6.5)

## 2020-01-14 LAB — TSH: TSH: 1.19 u[IU]/mL (ref 0.35–4.50)

## 2020-01-14 MED ORDER — VENLAFAXINE HCL ER 37.5 MG PO CP24: 37.5000 mg | ORAL_CAPSULE | Freq: Every day | ORAL | 5 refills | Status: DC

## 2020-01-14 NOTE — Assessment & Plan Note (Signed)
Family history of diabetes She is eating healthy and exercising A1c

## 2020-01-14 NOTE — Assessment & Plan Note (Signed)
Chronic Still having difficulty sleeping Trazodone did not seem to help much, especially in the last week Her anxiety is likely the culprit.  She may also be having some hormonal changes and will discuss that with her gynecologist 10 continue trazodone for now, but hopefully as the Effexor gets in your system she will not need it in her sleep will improve

## 2020-01-14 NOTE — Assessment & Plan Note (Signed)
History of iron deficiency Currently taking supplemental iron We will check iron level

## 2020-01-14 NOTE — Assessment & Plan Note (Signed)
Chronic Blood pressure better at home-she will continue to monitor it at home There is some element of whitecoat hypertension, which is not new Continue losartan 50 mg daily CMP

## 2020-01-14 NOTE — Assessment & Plan Note (Addendum)
Chronic Seeing a therapist Has been on Paxil and Lexapro in the past BuSpar may be helping a little, but feels funny if she does not take medication on time Start Effexor 37.5 mg daily-she is concerned about anything that could possibly cause weight gain and hopefully this will not do that Can continue BuSpar if she wants, but can go ahead and stop it as well She will update me in a couple of weeks and how she is doing with medication

## 2020-01-15 ENCOUNTER — Encounter: Payer: Self-pay | Admitting: Internal Medicine

## 2020-01-15 DIAGNOSIS — R7303 Prediabetes: Secondary | ICD-10-CM | POA: Insufficient documentation

## 2020-01-15 DIAGNOSIS — F410 Panic disorder [episodic paroxysmal anxiety] without agoraphobia: Secondary | ICD-10-CM

## 2020-01-15 LAB — IRON,TIBC AND FERRITIN PANEL
%SAT: 25 % (calc) (ref 16–45)
Ferritin: 30 ng/mL (ref 16–232)
Iron: 74 ug/dL (ref 40–190)
TIBC: 293 mcg/dL (calc) (ref 250–450)

## 2020-01-25 MED ORDER — VALSARTAN 80 MG PO TABS: 80.0000 mg | ORAL_TABLET | Freq: Every day | ORAL | 5 refills | Status: DC

## 2020-01-25 NOTE — Addendum Note (Signed)
Addended by: Pincus Sanes on: 01/25/2020 10:58 AM   Modules accepted: Orders

## 2020-01-27 MED ORDER — ESCITALOPRAM OXALATE 10 MG PO TABS: 10.0000 mg | ORAL_TABLET | Freq: Every day | ORAL | 5 refills | Status: DC

## 2020-01-27 NOTE — Addendum Note (Signed)
Addended by: Pincus Sanes on: 01/27/2020 08:55 PM   Modules accepted: Orders

## 2020-02-06 ENCOUNTER — Ambulatory Visit: Payer: 59 | Attending: Family

## 2020-02-06 DIAGNOSIS — Z23 Encounter for immunization: Secondary | ICD-10-CM

## 2020-02-06 NOTE — Progress Notes (Signed)
   Covid-19 Vaccination Clinic  Name:  Harmani Neto    MRN: [id number] DOB: 11/19/1975  02/06/2020  Ms. Parco was observed post Covid-19 immunization for 15 minutes without incident. She was provided with Vaccine Information Sheet and instruction to access the V-Safe system.   Ms. Blasko was instructed to call 911 with any severe reactions post vaccine: Marland Kitchen Difficulty breathing  . Swelling of face and throat  . A fast heartbeat  . A bad rash all over body  . Dizziness and weakness   Immunizations Administered    Name Date Dose VIS Date Route   Moderna COVID-19 Vaccine 02/06/2020 11:51 AM 0.5 mL 10/01/2019 Intramuscular   Manufacturer: Moderna   Lot: [id number]   NDC: 27062-376-28

## 2020-02-08 ENCOUNTER — Encounter: Payer: Self-pay | Admitting: Internal Medicine

## 2020-02-09 MED ORDER — ESCITALOPRAM OXALATE 10 MG PO TABS: ORAL_TABLET | ORAL | 1 refills | Status: DC

## 2020-03-10 ENCOUNTER — Ambulatory Visit: Payer: 59 | Attending: Family

## 2020-03-10 DIAGNOSIS — Z23 Encounter for immunization: Secondary | ICD-10-CM

## 2020-03-10 NOTE — Progress Notes (Signed)
   Covid-19 Vaccination Clinic  Name:  [name]    MRN: [id number] DOB: [birth year]  03/10/2020  [name] was observed post Covid-19 immunization for 15 minutes without incident. She was provided with Vaccine Information Sheet and instruction to access the V-Safe system.   [name] was instructed to call 911 with any severe reactions post vaccine: Marland Kitchen Difficulty breathing  . Swelling of face and throat  . A fast heartbeat  . A bad rash all over body  . Dizziness and weakness   Immunizations Administered    Name Date Dose VIS Date Route   Moderna COVID-19 Vaccine 03/10/2020 11:27 AM 0.5 mL 10/2019 Intramuscular   Manufacturer: Moderna   Lot: [id number]   NDC: 07867-544-92

## 2020-03-11 ENCOUNTER — Other Ambulatory Visit: Payer: Self-pay | Admitting: Internal Medicine

## 2020-07-06 ENCOUNTER — Other Ambulatory Visit: Payer: Self-pay | Admitting: Internal Medicine

## 2020-07-16 ENCOUNTER — Ambulatory Visit: Payer: 59 | Admitting: Internal Medicine

## 2020-07-26 NOTE — Progress Notes (Signed)
Subjective:    Patient ID: [name], female    DOB: Dec 22, 1975, 44 y.o.   MRN: [id number]  HPI The patient is here for follow up of their chronic medical problems, including anxiety, PMDD, prediabetes, htn, insomnia   She is taking all of her medications as prescribed.   She is exercising regularly.       Medications and allergies reviewed with patient and updated if appropriate.  Patient Active Problem List   Diagnosis Date Noted  . Prediabetes 01/15/2020  . History of iron deficiency 01/14/2020  . Family history of diabetes mellitus (DM) 01/05/2020  . Insomnia 12/03/2019  . Weight gain 12/03/2019  . PMDD (premenstrual dysphoric disorder) 05/23/2019  . Murmur, cardiac 11/30/2018  . Non-celiac gluten sensitivity 11/09/2017  . Panic anxiety syndrome 09/25/2013  . LIBIDO, DECREASED 09/29/2010  . Allergic rhinitis 03/25/2010  . HYPERTENSION, BENIGN SYSTEMIC 12/28/2006  . OVARIAN CYST 12/28/2006    Current Outpatient Medications on File Prior to Visit  Medication Sig Dispense Refill  . ASHWAGANDHA PO Take by mouth.    . Cyanocobalamin (B-12) 1000 MCG CAPS Takes one daily    . escitalopram (LEXAPRO) 10 MG tablet TAKE 2 TABLETS EVERY DAY FOR 2 WEEKS A MONTH,THEN 1 TABLET DAILY FOR THE OTHER 2 WEEKS 135 tablet 1  . Ferrous Sulfate (IRON PO) Take by mouth.    . Magnesium 300 MG CAPS Take by mouth.    . traZODone (DESYREL) 50 MG tablet Take 0.5-1 tablets (25-50 mg total) by mouth at bedtime as needed for sleep. 30 tablet 3  . valsartan (DIOVAN) 80 MG tablet TAKE 1 TABLET BY MOUTH EVERY DAY 90 tablet 1   No current facility-administered medications on file prior to visit.    Past Medical History:  Diagnosis Date  . ALLERGIC RHINITIS   . HYPERTENSION   . OVARIAN CYST   . Unspecified Anemia     Past Surgical History:  Procedure Laterality Date  . NO PAST SURGERIES      Social History   Socioeconomic History  . Marital status: Married    Spouse name: Not  on file  . Number of children: Not on file  . Years of education: Not on file  . Highest education level: Not on file  Occupational History  . Not on file  Tobacco Use  . Smoking status: Never Smoker  . Smokeless tobacco: Never Used  . Tobacco comment: Married, lives with spouse and 3 children ; [place]e Ha and [place].   Substance and Sexual Activity  . Alcohol use: No  . Drug use: No  . Sexual activity: Not on file  Other Topics Concern  . Not on file  Social History Narrative   Married, 3 kids   Exercise: Systems analyst 3/week, exercises on own 2/week   Not working   Social Determinants of Health   Financial Resource Strain:   . Difficulty of Paying Living Expenses: Not on file  Food Insecurity:   . Worried About Programme researcher, broadcasting/film/video in the Last Year: Not on file  . Ran Out of Food in the Last Year: Not on file  Transportation Needs:   . Lack of Transportation (Medical): Not on file  . Lack of Transportation (Non-Medical): Not on file  Physical Activity:   . Days of Exercise per Week: Not on file  . Minutes of Exercise per Session: Not on file  Stress:   . Feeling of Stress : Not on file  Social Connections:   .  Frequency of Communication with Friends and Family: Not on file  . Frequency of Social Gatherings with Friends and Family: Not on file  . Attends Religious Services: Not on file  . Active Member of Clubs or Organizations: Not on file  . Attends Banker Meetings: Not on file  . Marital Status: Not on file    Family History  Problem Relation Age of Onset  . Hypertension Mother   . Hyperlipidemia Mother   . Diabetes Mother   . Cancer Father        Prostate  . Hypertension Sister   . Diabetes Cousin     Review of Systems     Objective:  There were no vitals filed for this visit. BP Readings from Last 3 Encounters:  01/14/20 (!) 164/102  11/30/18 (!) 178/98  11/09/17 (!) 162/114   Wt Readings from Last 3 Encounters:  01/14/20 171 lb  12.8 oz (77.9 kg)  11/30/18 132 lb (59.9 kg)  11/09/17 129 lb (58.5 kg)   There is no height or weight on file to calculate BMI.   Physical Exam    Constitutional: Appears well-developed and well-nourished. No distress.  HENT:  Head: Normocephalic and atraumatic.  Neck: Neck supple. No tracheal deviation present. No thyromegaly present.  No cervical lymphadenopathy Cardiovascular: Normal rate, regular rhythm and normal heart sounds.   No murmur heard. No carotid bruit .  No edema Pulmonary/Chest: Effort normal and breath sounds normal. No respiratory distress. No has no wheezes. No rales.  Skin: Skin is warm and dry. Not diaphoretic.  Psychiatric: Normal mood and affect. Behavior is normal.      Assessment & Plan:    See Problem List for Assessment and Plan of chronic medical problems.    This visit occurred during the SARS-CoV-2 public health emergency.  Safety protocols were in place, including screening questions prior to the visit, additional usage of staff PPE, and extensive cleaning of exam room while observing appropriate contact time as indicated for disinfecting solutions.    This encounter was created in error - please disregard.

## 2020-07-26 NOTE — Patient Instructions (Signed)
  Blood work was ordered.     Medications reviewed and updated.  Changes include :     Your prescription(s) have been submitted to your pharmacy. Please take as directed and contact our office if you believe you are having problem(s) with the medication(s).  A referral was ordered for        Someone from their office will call you to schedule an appointment.    Please followup in 6 months   

## 2020-07-27 ENCOUNTER — Encounter: Payer: 59 | Admitting: Internal Medicine

## 2020-07-27 DIAGNOSIS — Z0289 Encounter for other administrative examinations: Secondary | ICD-10-CM

## 2020-07-29 NOTE — Patient Instructions (Addendum)
  Blood work was ordered.     Medications reviewed and updated.  Changes include :   Use alprazolam as needed for anxiety.   Your prescription(s) have been submitted to your pharmacy. Please take as directed and contact our office if you believe you are having problem(s) with the medication(s).    Please followup in 6 months

## 2020-07-29 NOTE — Progress Notes (Signed)
Subjective:    Patient ID: [name], female    DOB: [birth year], 44 y.o.   MRN: [id number]  HPI The patient is here for follow up of their chronic medical problems, including anxiety, PMDD, prediabetes, htn, insomnia   She is taking all of her medications as prescribed.   She is exercising regularly.   She is compliant with a low sodium diet.    BP 129/89 at home.  Occasionally the lower number is a little on the high side.  She does get nervous when she comes here.  Denies depression.  Her anxiety is high.  She does have near panic attacks.  Her anxiety is still worse around the time of her menses.  She does increase Lexapro dose at that time and then decrease in the back, but finds her needing a higher dose more.  She is not taking the sleep medication.  She does not really sleep well, but would prefer not to take it.  She is taking natural supplements to help with her anxiety and sleep.  Medications and allergies reviewed with patient and updated if appropriate.  Patient Active Problem List   Diagnosis Date Noted  . Prediabetes 01/15/2020  . History of iron deficiency 01/14/2020  . Family history of diabetes mellitus (DM) 01/05/2020  . Insomnia 12/03/2019  . Weight gain 12/03/2019  . PMDD (premenstrual dysphoric disorder) 05/23/2019  . Murmur, cardiac 11/30/2018  . Non-celiac gluten sensitivity 11/09/2017  . Panic anxiety syndrome 09/25/2013  . LIBIDO, DECREASED 09/29/2010  . Allergic rhinitis 03/25/2010  . HYPERTENSION, BENIGN SYSTEMIC 12/28/2006  . OVARIAN CYST 12/28/2006    Current Outpatient Medications on File Prior to Visit  Medication Sig Dispense Refill  . ASHWAGANDHA PO Take by mouth.    . Cyanocobalamin (B-12) 1000 MCG CAPS Takes one daily    . escitalopram (LEXAPRO) 10 MG tablet TAKE 2 TABLETS EVERY DAY FOR 2 WEEKS A MONTH,THEN 1 TABLET DAILY FOR THE OTHER 2 WEEKS 135 tablet 1  . Ferrous Sulfate (IRON PO) Take by mouth.    . Magnesium 300 MG CAPS  Take by mouth.    . valsartan (DIOVAN) 80 MG tablet TAKE 1 TABLET BY MOUTH EVERY DAY 90 tablet 1   No current facility-administered medications on file prior to visit.    Past Medical History:  Diagnosis Date  . ALLERGIC RHINITIS   . HYPERTENSION   . OVARIAN CYST   . Unspecified Anemia     Past Surgical History:  Procedure Laterality Date  . NO PAST SURGERIES      Social History   Socioeconomic History  . Marital status: Married    Spouse name: Not on file  . Number of children: Not on file  . Years of education: Not on file  . Highest education level: Not on file  Occupational History  . Not on file  Tobacco Use  . Smoking status: Never Smoker  . Smokeless tobacco: Never Used  . Tobacco comment: Married, lives with spouse and 3 children ; [place]e [place] and [place].   Substance and Sexual Activity  . Alcohol use: No  . Drug use: No  . Sexual activity: Not on file  Other Topics Concern  . Not on file  Social History Narrative   Married, 3 kids   Exercise: Systems analyst 3/week, exercises on own 2/week   Not working   Social Determinants of Health   Financial Resource Strain:   . Difficulty of Paying Living Expenses: Not on file  Food Insecurity:   . Worried About Programme researcher, broadcasting/film/video in the Last Year: Not on file  . Ran Out of Food in the Last Year: Not on file  Transportation Needs:   . Lack of Transportation (Medical): Not on file  . Lack of Transportation (Non-Medical): Not on file  Physical Activity:   . Days of Exercise per Week: Not on file  . Minutes of Exercise per Session: Not on file  Stress:   . Feeling of Stress : Not on file  Social Connections:   . Frequency of Communication with Friends and Family: Not on file  . Frequency of Social Gatherings with Friends and Family: Not on file  . Attends Religious Services: Not on file  . Active Member of Clubs or Organizations: Not on file  . Attends Banker Meetings: Not on file  . Marital  Status: Not on file    Family History  Problem Relation Age of Onset  . Hypertension Mother   . Hyperlipidemia Mother   . Diabetes Mother   . Cancer Father        Prostate  . Hypertension Sister   . Diabetes Cousin     Review of Systems  Constitutional: Negative for chills and fever.  Respiratory: Negative for cough, shortness of breath and wheezing.   Cardiovascular: Positive for palpitations (with anxiety). Negative for chest pain and leg swelling.  Genitourinary: Positive for dysuria (a little) and frequency. Negative for hematuria.  Musculoskeletal: Positive for back pain.  Neurological: Positive for headaches (sinus ). Negative for light-headedness.       Objective:   Vitals:   07/30/20 1137  BP: (!) 158/102  Pulse: 96  Temp: 98.4 F (36.9 C)  SpO2: 99%   BP Readings from Last 3 Encounters:  07/30/20 (!) 158/102  01/14/20 (!) 164/102  11/30/18 (!) 178/98   Wt Readings from Last 3 Encounters:  07/30/20 175 lb (79.4 kg)  01/14/20 171 lb 12.8 oz (77.9 kg)  11/30/18 132 lb (59.9 kg)   Body mass index is 31 kg/m.   Physical Exam    Constitutional: Appears well-developed and well-nourished. No distress.  HENT:  Head: Normocephalic and atraumatic.  Neck: Neck supple. No tracheal deviation present. No thyromegaly present.  No cervical lymphadenopathy Cardiovascular: Normal rate, regular rhythm and normal heart sounds.   No murmur heard. No carotid bruit .  No edema Pulmonary/Chest: Effort normal and breath sounds normal. No respiratory distress. No has no wheezes. No rales.  Skin: Skin is warm and dry. Not diaphoretic.  Psychiatric: Normal mood and affect. Behavior is normal.      Assessment & Plan:    See Problem List for Assessment and Plan of chronic medical problems.    This visit occurred during the SARS-CoV-2 public health emergency.  Safety protocols were in place, including screening questions prior to the visit, additional usage of staff PPE,  and extensive cleaning of exam room while observing appropriate contact time as indicated for disinfecting solutions.

## 2020-07-30 ENCOUNTER — Other Ambulatory Visit: Payer: Self-pay

## 2020-07-30 ENCOUNTER — Ambulatory Visit (INDEPENDENT_AMBULATORY_CARE_PROVIDER_SITE_OTHER): Payer: 59 | Admitting: Internal Medicine

## 2020-07-30 ENCOUNTER — Encounter: Payer: Self-pay | Admitting: Internal Medicine

## 2020-07-30 VITALS — BP 158/102 | HR 96 | Temp 98.4°F | Wt 175.0 lb

## 2020-07-30 DIAGNOSIS — R7303 Prediabetes: Secondary | ICD-10-CM

## 2020-07-30 DIAGNOSIS — F410 Panic disorder [episodic paroxysmal anxiety] without agoraphobia: Secondary | ICD-10-CM

## 2020-07-30 DIAGNOSIS — R350 Frequency of micturition: Secondary | ICD-10-CM

## 2020-07-30 DIAGNOSIS — F3281 Premenstrual dysphoric disorder: Secondary | ICD-10-CM | POA: Diagnosis not present

## 2020-07-30 DIAGNOSIS — I10 Essential (primary) hypertension: Secondary | ICD-10-CM | POA: Diagnosis not present

## 2020-07-30 DIAGNOSIS — G4709 Other insomnia: Secondary | ICD-10-CM

## 2020-07-30 MED ORDER — ALPRAZOLAM 0.5 MG PO TABS: 0.2500 mg | ORAL_TABLET | Freq: Every day | ORAL | 0 refills | Status: DC | PRN

## 2020-07-30 NOTE — Assessment & Plan Note (Signed)
Chronic Anxiety level is still high and occasionally has panic attacks We will continue Lexapro-okay to increase slightly during time of menses and then decrease back down.  Discussed that I do not want her on the high dose long-term Lexapro 30-40 mg during menses and 20 mg other times We will start alprazolam 0.25 mg - 0.5 mg daily as needed for panic attacks Continue regular exercise Discussed breathing techniques and meditation

## 2020-07-30 NOTE — Assessment & Plan Note (Addendum)
chroinc Struggles during menses, but otherwise sleeps okay No longer taking trazodone New natural supplements, regular exercise and work on stress reduction

## 2020-07-30 NOTE — Assessment & Plan Note (Signed)
Chronic Has an element of whitecoat hypertension so her BP here is much higher than home At home her blood pressure is borderline high in she will monitor closely and if frequently borderline high or high diastolically she will let me know For now continue valsartan 80 mg daily If her blood pressure is persistently elevated we will change this to Diovan HCT 80-12.5 BMP today

## 2020-07-30 NOTE — Assessment & Plan Note (Signed)
Acute States urinary frequency and has had a couple of mild episodes of dysuria ?  UTI Check urinalysis, urine culture Continue increased fluids

## 2020-07-30 NOTE — Assessment & Plan Note (Signed)
Chronic Continue Lexapro-she will continue to increase the dose to 30-40 mg during her menses only and then 20 mg the rest of the time-overall this has helped with some of her symptoms Continue regular exercise We will try alprazolam 0.25-0.5 mg daily as needed for panic attacks

## 2020-07-30 NOTE — Assessment & Plan Note (Signed)
Chronic Check a1c Low sugar / carb diet Stressed regular exercise  

## 2020-07-31 LAB — URINE CULTURE

## 2020-07-31 LAB — URINALYSIS, ROUTINE W REFLEX MICROSCOPIC
Bilirubin Urine: NEGATIVE
Glucose, UA: NEGATIVE
Hgb urine dipstick: NEGATIVE
Ketones, ur: NEGATIVE
Leukocytes,Ua: NEGATIVE
Nitrite: NEGATIVE
Protein, ur: NEGATIVE
Specific Gravity, Urine: 1.009 (ref 1.001–1.03)
pH: 8 (ref 5.0–8.0)

## 2020-07-31 LAB — BASIC METABOLIC PANEL WITH GFR
BUN: 10 mg/dL (ref 7–25)
CO2: 27 mmol/L (ref 20–32)
Calcium: 9.4 mg/dL (ref 8.6–10.2)
Chloride: 104 mmol/L (ref 98–110)
Creat: 0.71 mg/dL (ref 0.50–1.10)
GFR, Est African American: 123 mL/min/{1.73_m2} (ref >=60)
GFR, Est Non African American: 106 mL/min/{1.73_m2} (ref >=60)
Glucose, Bld: 92 mg/dL (ref 65–99)
Potassium: 4.3 mmol/L (ref 3.5–5.3)
Sodium: 140 mmol/L (ref 135–146)

## 2020-07-31 LAB — HEMOGLOBIN A1C
Hgb A1c MFr Bld: 5.7 % of total Hgb — ABNORMAL HIGH (ref <5.7)
Mean Plasma Glucose: 117 (calc)
eAG (mmol/L): 6.5 (calc)

## 2020-08-18 ENCOUNTER — Encounter: Payer: Self-pay | Admitting: Internal Medicine

## 2020-08-19 MED ORDER — HYDROCHLOROTHIAZIDE 25 MG PO TABS: 25.0000 mg | ORAL_TABLET | Freq: Every day | ORAL | 3 refills | Status: DC

## 2020-08-19 NOTE — Addendum Note (Signed)
Addended by: Pincus Sanes on: 08/19/2020 08:42 PM   Modules accepted: Orders

## 2020-11-02 ENCOUNTER — Other Ambulatory Visit: Payer: Self-pay | Admitting: Internal Medicine

## 2020-11-02 ENCOUNTER — Encounter: Payer: Self-pay | Admitting: Internal Medicine

## 2020-11-03 NOTE — Progress Notes (Signed)
Virtual Visit via Video Note  I connected with [name] on 11/03/20 at  2:30 PM EST by a video enabled telemedicine application and verified that I am speaking with the correct person using two identifiers.   I discussed the limitations of evaluation and management by telemedicine and the availability of in person appointments. The patient expressed understanding and agreed to proceed.  Present for the visit:  Myself, Dr Cheryll Cockayne, [name].  The patient is currently at home and I am in the office.    No referring provider.    History of Present Illness: She is here for an acute visit for sinus symptoms. One of her daughters was sick.  She had a PCR covid test and it was neg.    Her symptoms started about three days ago.   She is experiencing wet cough, ear pain, sinus pain, sore throat, some teeth pain, headches and dizziness.    She has tried taking coricidan cough meds, oil of oregano, nasal rinse, allergy nose drops   Review of Systems  Constitutional: Negative for fever.  HENT: Positive for congestion, ear pain, sinus pain and sore throat.        Some teeth pain  Respiratory: Positive for cough and sputum production. Negative for shortness of breath and wheezing.   Cardiovascular: Negative for chest pain.  Neurological: Positive for dizziness and headaches (mild).      Social History   Socioeconomic History  . Marital status: Married    Spouse name: Not on file  . Number of children: Not on file  . Years of education: Not on file  . Highest education level: Not on file  Occupational History  . Not on file  Tobacco Use  . Smoking status: Never Smoker  . Smokeless tobacco: Never Used  . Tobacco comment: Married, lives with spouse and 3 children ; Christiane [place] and [place].   Substance and Sexual Activity  . Alcohol use: No  . Drug use: No  . Sexual activity: Not on file  Other Topics Concern  . Not on file  Social History Narrative   Married, 3 kids    Exercise: Systems analyst 3/week, exercises on own 2/week   Not working   Social Determinants of Corporate investment banker Strain: Not on file  Food Insecurity: Not on file  Transportation Needs: Not on file  Physical Activity: Not on file  Stress: Not on file  Social Connections: Not on file     Observations/Objective: Appears well in NAD Intermittent cough Breathing normally  Assessment and Plan:  See Problem List for Assessment and Plan of chronic medical problems.   Follow Up Instructions:    I discussed the assessment and treatment plan with the patient. The patient was provided an opportunity to ask questions and all were answered. The patient agreed with the plan and demonstrated an understanding of the instructions.   The patient was advised to call back or seek an in-person evaluation if the symptoms worsen or if the condition fails to improve as anticipated.    Pincus Sanes, MD

## 2020-11-04 ENCOUNTER — Telehealth (INDEPENDENT_AMBULATORY_CARE_PROVIDER_SITE_OTHER): Payer: 59 | Admitting: Internal Medicine

## 2020-11-04 ENCOUNTER — Encounter: Payer: Self-pay | Admitting: Internal Medicine

## 2020-11-04 DIAGNOSIS — J0100 Acute maxillary sinusitis, unspecified: Secondary | ICD-10-CM

## 2020-11-04 DIAGNOSIS — J019 Acute sinusitis, unspecified: Secondary | ICD-10-CM | POA: Insufficient documentation

## 2020-11-04 NOTE — Assessment & Plan Note (Signed)
Acute Symptoms likely viral in nature covid PCR test neg Continue symptomatic treatment with over-the-counter cold medications, Tylenol/ibuprofen Increase rest and fluids Call if symptoms worsen or do not improve - then will consider abx

## 2020-11-07 MED ORDER — SULFAMETHOXAZOLE-TRIMETHOPRIM 800-160 MG PO TABS: 1.0000 | ORAL_TABLET | Freq: Two times a day (BID) | ORAL | 0 refills | Status: DC

## 2020-11-23 ENCOUNTER — Encounter: Payer: Self-pay | Admitting: Internal Medicine

## 2020-11-24 NOTE — Assessment & Plan Note (Addendum)
Chronic Has tried melatonin, lemon balm, magnolia bark and other natural things as well as trazodone Trazodone was not effective Sleep is at this point significantly affecting her quality of life and making her anxiety worse Discussed options and she is interested in trying [employer] 2 mg nightly She will let me know if this is not covered or if it is not effective She typically has difficulty falling asleep so Sonata might be a good medication to try as well Advised her to reevaluate her anxiety and make sure it is controlled when she is sleeping better to make sure that is not contributing to some of her insomnia She is seeing a therapist and will continue

## 2020-11-24 NOTE — Progress Notes (Signed)
Virtual Visit via Video Note  I connected with [name] on 11/25/20 at  8:30 AM EST by a video enabled telemedicine application and verified that I am speaking with the correct person using two identifiers.   I discussed the limitations of evaluation and management by telemedicine and the availability of in person appointments. The patient expressed understanding and agreed to proceed.  Present for the visit:  Myself, Dr Cheryll Cockayne, [name].  The patient is currently at home and I am in the office.    No referring provider.    History of Present Illness: This is an acute visit for insomnia.  She goes to sleep around 2-3 am.  Her bedroom is cool, she winds down with a shower and she lays in bed and just stares.  She is now anxious because she knows she will not sleep.  It has been over a month.  She has taken xanax as needed and it works but she doe snot want to take it daily.      She has tried lemon balm, magnolia bark, other natural sleep aids and trazodone ( didn't work).  Initially some of the natural stuff helped a little, but no longer works.  She does have some anxiety, but she feels she will be okay if she just is able to sleep.  She is currently taking 10 mg of Lexapro daily.  Reviewed all of the different natural options.  Discussed some of the other prescription options.  Unfortunately I think she does need something for her sleep and the best option is something like Ambien, Lunesta or Sonata.   Social History   Socioeconomic History  . Marital status: Married    Spouse name: Not on file  . Number of children: Not on file  . Years of education: Not on file  . Highest education level: Not on file  Occupational History  . Not on file  Tobacco Use  . Smoking status: Never Smoker  . Smokeless tobacco: Never Used  . Tobacco comment: Married, lives with spouse and 3 children ; [place]e Ha and [place].   Substance and Sexual Activity  . Alcohol use: No  .  Drug use: No  . Sexual activity: Not on file  Other Topics Concern  . Not on file  Social History Narrative   Married, 3 kids   Exercise: Systems analyst 3/week, exercises on own 2/week   Not working   Social Determinants of Corporate investment banker Strain: Not on file  Food Insecurity: Not on file  Transportation Needs: Not on file  Physical Activity: Not on file  Stress: Not on file  Social Connections: Not on file     Observations/Objective: Appears well in NAD Breathing normally Mood normal, behavior normal  Assessment and Plan:  See Problem List for Assessment and Plan of chronic medical problems.   Follow Up Instructions:    I discussed the assessment and treatment plan with the patient. The patient was provided an opportunity to ask questions and all were answered. The patient agreed with the plan and demonstrated an understanding of the instructions.   The patient was advised to call back or seek an in-person evaluation if the symptoms worsen or if the condition fails to improve as anticipated.    Pincus Sanes, MD

## 2020-11-25 ENCOUNTER — Telehealth (INDEPENDENT_AMBULATORY_CARE_PROVIDER_SITE_OTHER): Payer: 59 | Admitting: Internal Medicine

## 2020-11-25 ENCOUNTER — Encounter: Payer: Self-pay | Admitting: Internal Medicine

## 2020-11-25 DIAGNOSIS — G4709 Other insomnia: Secondary | ICD-10-CM | POA: Diagnosis not present

## 2020-11-25 MED ORDER — ESZOPICLONE 2 MG PO TABS: 2.0000 mg | ORAL_TABLET | Freq: Every evening | ORAL | 0 refills | Status: DC | PRN

## 2020-12-18 ENCOUNTER — Other Ambulatory Visit: Payer: Self-pay | Admitting: Internal Medicine

## 2020-12-21 ENCOUNTER — Telehealth: Payer: Self-pay | Admitting: Internal Medicine

## 2020-12-21 NOTE — Telephone Encounter (Signed)
Yes - that would be ok - see if that is what she wants.  No other treatment for covid at this time.  If symptoms worsen - should make a virtual appt

## 2020-12-21 NOTE — Telephone Encounter (Signed)
Attempted to contact patient but VM is full.   My-chart message sent to her.

## 2020-12-21 NOTE — Telephone Encounter (Signed)
[facility]   Caller states she tested positive for COVID and would like something called in. Fever of 99.0, cough, body aches, and HA.  Advised to contact PCP within 24 hours.   Please advise

## 2020-12-22 NOTE — Telephone Encounter (Signed)
°  Follow up message  Patient requesting cough medication be called to [id number] - [place], [place] - [address]

## 2020-12-23 ENCOUNTER — Other Ambulatory Visit: Payer: Self-pay | Admitting: Internal Medicine

## 2020-12-24 MED ORDER — BENZONATATE 200 MG PO CAPS: 200.0000 mg | ORAL_CAPSULE | Freq: Three times a day (TID) | ORAL | 0 refills | Status: DC | PRN

## 2020-12-24 MED ORDER — HYDROCOD POLST-CPM POLST ER 10-8 MG/5ML PO SUER
5.0000 mL | Freq: Two times a day (BID) | ORAL | 0 refills | Status: DC | PRN
Start: 2020-12-24 — End: 2021-02-10

## 2021-01-11 ENCOUNTER — Other Ambulatory Visit: Payer: Self-pay | Admitting: Internal Medicine

## 2021-01-27 ENCOUNTER — Ambulatory Visit: Payer: 59 | Admitting: Internal Medicine

## 2021-02-04 ENCOUNTER — Ambulatory Visit: Payer: 59 | Admitting: Internal Medicine

## 2021-02-09 NOTE — Progress Notes (Signed)
Subjective:    Patient ID: [name], female    DOB: [birth year], 45 y.o.   MRN: [id number]  HPI The patient is here for follow up of their chronic medical problems, including htn, prediabetes, anxiety, PMDD, insomnia  BP well controlled at home.  She is exercising regularly.    She is taking all of her medications as prescribed.     She has been seeing her gyn and going [facility].  She has been doing the eating plan according to fluspirilene has lost a few pounds.  She is considering testosterone pellets.  Her gynecologist started her on on sertraline and she tried tapering off the Lexapro, but did not do well and think she would prefer to stay on the Lexapro.  It does help with her anxiety.  She was started on bupropion and that has helped some with her energy level.   Medications and allergies reviewed with patient and updated if appropriate.  Patient Active Problem List   Diagnosis Date Noted  . Acute sinus infection 11/04/2020  . Prediabetes 01/15/2020  . History of iron deficiency 01/14/2020  . Family history of diabetes mellitus (DM) 01/05/2020  . Insomnia 12/03/2019  . Weight gain 12/03/2019  . PMDD (premenstrual dysphoric disorder) 05/23/2019  . Murmur, cardiac 11/30/2018  . Non-celiac gluten sensitivity 11/09/2017  . Urinary frequency 02/04/2015  . Panic anxiety syndrome 09/25/2013  . LIBIDO, DECREASED 09/29/2010  . Allergic rhinitis 03/25/2010  . HYPERTENSION, BENIGN SYSTEMIC 12/28/2006  . OVARIAN CYST 12/28/2006    Current Outpatient Medications on File Prior to Visit  Medication Sig Dispense Refill  . ALPRAZolam (XANAX) 0.5 MG tablet Take 0.5-1 tablets (0.25-0.5 mg total) by mouth daily as needed for anxiety. 30 tablet 0  . ASHWAGANDHA PO Take by mouth.    . Cholecalciferol (VITAMIN D) 50 MCG (2000 UT) tablet 1 tablet    . Cyanocobalamin (B-12) 1000 MCG CAPS Takes one daily    . Ferrous Sulfate (IRON PO) Take by mouth.    . hydrochlorothiazide  (HYDRODIURIL) 25 MG tablet TAKE 1 TABLET BY MOUTH EVERY DAY 90 tablet 1  . Magnesium 300 MG CAPS Take by mouth.    . RESTASIS 0.05 % ophthalmic emulsion     . valsartan (DIOVAN) 80 MG tablet TAKE 1 TABLET BY MOUTH EVERY DAY 90 tablet 1  . buPROPion (WELLBUTRIN XL) 150 MG 24 hr tablet Take 1 tablet by mouth every morning.     No current facility-administered medications on file prior to visit.    Past Medical History:  Diagnosis Date  . ALLERGIC RHINITIS   . HYPERTENSION   . OVARIAN CYST   . Unspecified Anemia     Past Surgical History:  Procedure Laterality Date  . NO PAST SURGERIES      Social History   Socioeconomic History  . Marital status: Married    Spouse name: Not on file  . Number of children: Not on file  . Years of education: Not on file  . Highest education level: Not on file  Occupational History  . Not on file  Tobacco Use  . Smoking status: Never Smoker  . Smokeless tobacco: Never Used  . Tobacco comment: Married, lives with spouse and 3 children ; [place]e Ha and [place].   Substance and Sexual Activity  . Alcohol use: No  . Drug use: No  . Sexual activity: Not on file  Other Topics Concern  . Not on file  Social History Narrative   Married,  3 kids   Exercise: personal trainer 3/week, exercises on own 2/week   Not working   Social Determinants of Corporate investment banker Strain: Not on file  Food Insecurity: Not on file  Transportation Needs: Not on file  Physical Activity: Not on file  Stress: Not on file  Social Connections: Not on file    Family History  Problem Relation Age of Onset  . Hypertension Mother   . Hyperlipidemia Mother   . Diabetes Mother   . Cancer Father        Prostate  . Hypertension Sister   . Diabetes Cousin     Review of Systems  Constitutional: Negative for chills and fever.  Respiratory: Positive for cough (allergies). Negative for shortness of breath and wheezing.   Cardiovascular: Positive for  palpitations. Negative for chest pain and leg swelling.  Neurological: Negative for light-headedness and headaches.       Objective:   Vitals:   02/10/21 1125  BP: 130/88  Pulse: 93  Temp: 98.3 F (36.8 C)  SpO2: 95%   BP Readings from Last 3 Encounters:  02/10/21 130/88  07/30/20 (!) 158/102  01/14/20 (!) 164/102   Wt Readings from Last 3 Encounters:  02/10/21 173 lb (78.5 kg)  07/30/20 175 lb (79.4 kg)  01/14/20 171 lb 12.8 oz (77.9 kg)   Body mass index is 30.65 kg/m.   Depression screen [facility] 2/9 02/10/2021 01/17/2020 11/09/2017 09/26/2012  Decreased Interest 1 1 0 0  Down, Depressed, Hopeless 1 1 0 0  PHQ - 2 Score 2 2 0 0  Altered sleeping 1 1 - -  Tired, decreased energy 1 1 - -  Change in appetite 0 1 - -  Feeling bad or failure about yourself  1 1 - -  Trouble concentrating 0 1 - -  Moving slowly or fidgety/restless 0 1 - -  Suicidal thoughts 0 1 - -  PHQ-9 Score 5 9 - -  Difficult doing work/chores Somewhat difficult - - -    GAD 7 : Generalized Anxiety Score 02/10/2021 01/17/2020  Nervous, Anxious, on Edge 2 2  Control/stop worrying 2 2  Worry too much - different things 2 2  Trouble relaxing 2 2  Restless 2 2  Easily annoyed or irritable 1 3  Afraid - awful might happen 2 3  Total GAD 7 Score 13 16  Anxiety Difficulty Very difficult -      Physical Exam    Constitutional: Appears well-developed and well-nourished. No distress.  HENT:  Head: Normocephalic and atraumatic.  Neck: Neck supple. No tracheal deviation present. No thyromegaly present.  No cervical lymphadenopathy Cardiovascular: Normal rate, regular rhythm and normal heart sounds.   No murmur heard. No carotid bruit .  No edema Pulmonary/Chest: Effort normal and breath sounds normal. No respiratory distress. No has no wheezes. No rales.  Skin: Skin is warm and dry. Not diaphoretic.  Psychiatric: Normal mood and affect. Behavior is normal.      Assessment & Plan:    See Problem  List for Assessment and Plan of chronic medical problems.    This visit occurred during the SARS-CoV-2 public health emergency.  Safety protocols were in place, including screening questions prior to the visit, additional usage of staff PPE, and extensive cleaning of exam room while observing appropriate contact time as indicated for disinfecting solutions.

## 2021-02-09 NOTE — Patient Instructions (Addendum)
    Medications changes include :  lexparo 15 mg daily  Your prescription(s) have been submitted to your pharmacy. Please take as directed and contact our office if you believe you are having problem(s) with the medication(s).    Please followup in 6 months

## 2021-02-10 ENCOUNTER — Encounter: Payer: Self-pay | Admitting: Internal Medicine

## 2021-02-10 ENCOUNTER — Other Ambulatory Visit: Payer: Self-pay

## 2021-02-10 ENCOUNTER — Ambulatory Visit: Payer: 59 | Admitting: Internal Medicine

## 2021-02-10 VITALS — BP 130/88 | HR 93 | Temp 98.3°F | Ht 63.0 in | Wt 173.0 lb

## 2021-02-10 DIAGNOSIS — F3281 Premenstrual dysphoric disorder: Secondary | ICD-10-CM

## 2021-02-10 DIAGNOSIS — F410 Panic disorder [episodic paroxysmal anxiety] without agoraphobia: Secondary | ICD-10-CM

## 2021-02-10 DIAGNOSIS — I10 Essential (primary) hypertension: Secondary | ICD-10-CM | POA: Diagnosis not present

## 2021-02-10 DIAGNOSIS — G4709 Other insomnia: Secondary | ICD-10-CM

## 2021-02-10 DIAGNOSIS — R7303 Prediabetes: Secondary | ICD-10-CM | POA: Diagnosis not present

## 2021-02-10 MED ORDER — ESCITALOPRAM OXALATE 10 MG PO TABS: 15.0000 mg | ORAL_TABLET | Freq: Every day | ORAL | 1 refills | Status: DC

## 2021-02-10 MED ORDER — ESZOPICLONE 2 MG PO TABS: 2.0000 mg | ORAL_TABLET | Freq: Every evening | ORAL | 0 refills | Status: DC | PRN

## 2021-02-10 NOTE — Assessment & Plan Note (Signed)
Chronic Sugar has been well controlled and she just had her A1c checked recently and was normal Continue regular exercise, healthy diet and weight loss efforts

## 2021-02-10 NOTE — Assessment & Plan Note (Signed)
Chronic Blood pressure well controlled at home, fairly controlled here today Continue Diovan 80 mg daily, hydrochlorothiazide 25 mg daily Hold off on blood work since she just had it done elsewhere and it was normal

## 2021-02-10 NOTE — Assessment & Plan Note (Signed)
Chronic Lunesta 2 mg at night as needed is effective and she would like to continue it Refilled today-Lunesta 2 mg at night as needed

## 2021-02-10 NOTE — Assessment & Plan Note (Signed)
Chronic Following with gynecology Continue Lexapro-increase to 50 mg every day Continue Wellbutrin 150 mg XL daily Continue alprazolam 0.25 mg-0.5 mg daily as needed Continue regular exercise

## 2021-02-10 NOTE — Assessment & Plan Note (Addendum)
Chronic Not ideally controlled per patient and GAD-7 scale Recently switched from Lexapro to sertraline by her gynecologist, but she did not do well with this and she thinks she wants to stay on the Lexapro Has been taking 10 mg of Lexapro except for 2 weeks of the month because of her menstrual cycle she has been taking 15 and that seems to work well.  She thinks 20 works well, but she is trying not to take too much Advised takes 15 mg every day to see if that helps more with her anxiety.  Discussed that we can further adjust this if needed Continue alprazolam 0.25 mg-0.5 mg daily as needed Continue Wellbutrin 150 mg XL daily

## 2021-02-11 ENCOUNTER — Ambulatory Visit: Payer: 59 | Admitting: Internal Medicine

## 2021-02-19 ENCOUNTER — Other Ambulatory Visit: Payer: Self-pay

## 2021-02-19 ENCOUNTER — Observation Stay (HOSPITAL_COMMUNITY)
Admission: EM | Admit: 2021-02-19 | Discharge: 2021-02-21 | Disposition: A | Discharge status: Home / Self Care | Payer: 59 | Attending: Surgery | Admitting: Surgery

## 2021-02-19 ENCOUNTER — Encounter (HOSPITAL_COMMUNITY): Payer: Self-pay

## 2021-02-19 DIAGNOSIS — Z20822 Contact with and (suspected) exposure to covid-19: Secondary | ICD-10-CM | POA: Insufficient documentation

## 2021-02-19 DIAGNOSIS — K353 Acute appendicitis with localized peritonitis, without perforation or gangrene: Principal | ICD-10-CM | POA: Insufficient documentation

## 2021-02-19 DIAGNOSIS — I10 Essential (primary) hypertension: Secondary | ICD-10-CM | POA: Insufficient documentation

## 2021-02-19 DIAGNOSIS — Z79899 Other long term (current) drug therapy: Secondary | ICD-10-CM | POA: Insufficient documentation

## 2021-02-19 DIAGNOSIS — K3580 Unspecified acute appendicitis: Secondary | ICD-10-CM | POA: Diagnosis present

## 2021-02-19 DIAGNOSIS — R1031 Right lower quadrant pain: Secondary | ICD-10-CM | POA: Diagnosis present

## 2021-02-19 LAB — URINALYSIS, ROUTINE W REFLEX MICROSCOPIC
Bilirubin Urine: NEGATIVE
Glucose, UA: NEGATIVE mg/dL
Hgb urine dipstick: NEGATIVE
Ketones, ur: 80 mg/dL — AB
Leukocytes,Ua: NEGATIVE
Nitrite: NEGATIVE
Protein, ur: 30 mg/dL — AB
Specific Gravity, Urine: 1.02 (ref 1.005–1.030)
pH: 9 — ABNORMAL HIGH (ref 5.0–8.0)

## 2021-02-19 LAB — CBC WITH DIFFERENTIAL/PLATELET
Abs Immature Granulocytes: 0.06 10*3/uL (ref 0.00–0.07)
Basophils Absolute: 0 10*3/uL (ref 0.0–0.1)
Basophils Relative: 0 %
Eosinophils Absolute: 0 10*3/uL (ref 0.0–0.5)
Eosinophils Relative: 0 %
HCT: 37.8 % (ref 36.0–46.0)
Hemoglobin: 12.1 g/dL (ref 12.0–15.0)
Immature Granulocytes: 0 %
Lymphocytes Relative: 6 %
Lymphs Abs: 1 10*3/uL (ref 0.7–4.0)
MCH: 27.3 pg (ref 26.0–34.0)
MCHC: 32 g/dL (ref 30.0–36.0)
MCV: 85.3 fL (ref 80.0–100.0)
Monocytes Absolute: 0.5 10*3/uL (ref 0.1–1.0)
Monocytes Relative: 3 %
Neutro Abs: 14.3 10*3/uL — ABNORMAL HIGH (ref 1.7–7.7)
Neutrophils Relative %: 91 %
Platelets: 283 10*3/uL (ref 150–400)
RBC: 4.43 MIL/uL (ref 3.87–5.11)
RDW: 14.2 % (ref 11.5–15.5)
WBC: 15.9 10*3/uL — ABNORMAL HIGH (ref 4.0–10.5)
nRBC: 0 % (ref 0.0–0.2)

## 2021-02-19 LAB — COMPREHENSIVE METABOLIC PANEL
ALT: 24 U/L (ref 0–44)
AST: 19 U/L (ref 15–41)
Albumin: 4.1 g/dL (ref 3.5–5.0)
Alkaline Phosphatase: 58 U/L (ref 38–126)
Anion gap: 10 (ref 5–15)
BUN: 10 mg/dL (ref 6–20)
CO2: 23 mmol/L (ref 22–32)
Calcium: 9.2 mg/dL (ref 8.9–10.3)
Chloride: 101 mmol/L (ref 98–111)
Creatinine, Ser: 0.82 mg/dL (ref 0.44–1.00)
GFR, Estimated: >60 mL/min (ref >60)
Glucose, Bld: 128 mg/dL — ABNORMAL HIGH (ref 70–99)
Potassium: 3.5 mmol/L (ref 3.5–5.1)
Sodium: 134 mmol/L — ABNORMAL LOW (ref 135–145)
Total Bilirubin: 0.3 mg/dL (ref 0.3–1.2)
Total Protein: 7.5 g/dL (ref 6.5–8.1)

## 2021-02-19 LAB — I-STAT BETA HCG BLOOD, ED (MC, WL, AP ONLY): I-stat hCG, quantitative: 8.7 m[IU]/mL — ABNORMAL HIGH (ref <5)

## 2021-02-19 LAB — LIPASE, BLOOD: Lipase: 23 U/L (ref 11–51)

## 2021-02-19 NOTE — ED Triage Notes (Signed)
Emergency Medicine Provider Triage Evaluation Note  [name] , a 45 y.o. female  was evaluated in triage.  Pt complains of upper quadrant abdominal pain.  Patient reports that she has a history of gluten intolerance.  Patient reports that she had exposure to gluten yesterday.  Reports waking this morning with upper quadrant abdominal pain.  Patient describes her pain as a cramping/spasm sensation.  She reports that normally occurs after food exposure however she can improve symptoms by taking charcoal.  Patient reports no relief of symptoms today.  Symptoms have progressively worsened.  Patient endorses nausea and vomiting.  Patient denies any hematemesis or coffee-ground emesis.  Patient denies history of abdominal surgeries.  LMP 4/10  Review of Systems  Positive: Nausea, vomiting, abd pain, diarrhea Negative: Fever, chills, cough, chest pain, shortness of breath  Physical Exam  BP (!) 149/97 (BP Location: Right Arm)   Pulse (!) 105   Temp 99.3 F (37.4 C) (Oral)   Resp (!) 22   Ht 5\' 3"  (1.6 m)   Wt 78.5 kg   LMP 02/07/2021   SpO2 100%   BMI 30.65 kg/m  Gen:   Awake, no distress   HEENT:  Atraumatic  Resp:  Normal effort  Cardiac:  Tachy rate of 105  Abd:   Nondistended, soft, tenderness to right upper quadrant MSK:   Moves extremities without difficulty  Neuro:  Speech clear   Medical Decision Making  Medically screening exam initiated at 9:36 PM.  Appropriate orders placed.  Kiona Blume was informed that the remainder of the evaluation will be completed by another provider, this initial triage assessment does not replace that evaluation, and the importance of remaining in the ED until their evaluation is complete.  Clinical Impression   The patient appears stable so that the remainder of the work up may be completed by another provider.      Derrel Nip, Haskel Schroeder 02/19/21 2147

## 2021-02-19 NOTE — ED Triage Notes (Signed)
Patient reports abdominal pain and spasms, hx of gluten intolerance, states she had an exposure yesterday, reports diarrhea and nausea. States usually it goes away but this pain has not

## 2021-02-20 ENCOUNTER — Encounter (HOSPITAL_COMMUNITY): Payer: Self-pay

## 2021-02-20 ENCOUNTER — Observation Stay (HOSPITAL_COMMUNITY): Payer: 59 | Admitting: Certified Registered"

## 2021-02-20 ENCOUNTER — Emergency Department (HOSPITAL_COMMUNITY): Payer: 59

## 2021-02-20 ENCOUNTER — Encounter (HOSPITAL_COMMUNITY)
Admission: EM | Disposition: A | Discharge status: Home / Self Care | Payer: Self-pay | Source: Home / Self Care | Attending: Emergency Medicine

## 2021-02-20 DIAGNOSIS — K3580 Unspecified acute appendicitis: Secondary | ICD-10-CM | POA: Diagnosis present

## 2021-02-20 HISTORY — PX: LAPAROSCOPIC APPENDECTOMY: SHX408

## 2021-02-20 LAB — CBC
HCT: 36.7 % (ref 36.0–46.0)
Hemoglobin: 12.1 g/dL (ref 12.0–15.0)
MCH: 27.3 pg (ref 26.0–34.0)
MCHC: 33 g/dL (ref 30.0–36.0)
MCV: 82.7 fL (ref 80.0–100.0)
Platelets: 269 10*3/uL (ref 150–400)
RBC: 4.44 MIL/uL (ref 3.87–5.11)
RDW: 14.2 % (ref 11.5–15.5)
WBC: 16.6 10*3/uL — ABNORMAL HIGH (ref 4.0–10.5)
nRBC: 0 % (ref 0.0–0.2)

## 2021-02-20 LAB — CREATININE, SERUM
Creatinine, Ser: 0.87 mg/dL (ref 0.44–1.00)
GFR, Estimated: >60 mL/min (ref >60)

## 2021-02-20 LAB — RESP PANEL BY RT-PCR (FLU A&B, COVID) ARPGX2
Influenza A by PCR: NEGATIVE
Influenza B by PCR: NEGATIVE
SARS Coronavirus 2 by RT PCR: NEGATIVE

## 2021-02-20 LAB — PREGNANCY, URINE: Preg Test, Ur: NEGATIVE

## 2021-02-20 SURGERY — APPENDECTOMY, LAPAROSCOPIC
Anesthesia: General | Site: Abdomen

## 2021-02-20 MED ORDER — DEXAMETHASONE SODIUM PHOSPHATE 10 MG/ML IJ SOLN
INTRAMUSCULAR | Status: AC
  Filled 2021-02-20: qty 1

## 2021-02-20 MED ORDER — LABETALOL HCL 5 MG/ML IV SOLN
INTRAVENOUS | Status: DC | PRN
  Administered 2021-02-20 (×2): 5 mg via INTRAVENOUS

## 2021-02-20 MED ORDER — SODIUM CHLORIDE 0.9 % IR SOLN
Status: DC | PRN
  Administered 2021-02-20: 1000 mL

## 2021-02-20 MED ORDER — CHLORHEXIDINE GLUCONATE 0.12 % MT SOLN
OROMUCOSAL | Status: AC
  Administered 2021-02-20: 15 mL via OROMUCOSAL
  Filled 2021-02-20: qty 15

## 2021-02-20 MED ORDER — SODIUM CHLORIDE 0.9 % IV SOLN
1.0000 g | Freq: Once | INTRAVENOUS | Status: AC
  Administered 2021-02-20: 1 g via INTRAVENOUS
  Filled 2021-02-20: qty 10

## 2021-02-20 MED ORDER — ROCURONIUM BROMIDE 10 MG/ML (PF) SYRINGE
PREFILLED_SYRINGE | INTRAVENOUS | Status: AC
  Filled 2021-02-20: qty 10

## 2021-02-20 MED ORDER — LACTATED RINGERS IV SOLN: INTRAVENOUS | Status: DC

## 2021-02-20 MED ORDER — ONDANSETRON HCL 4 MG/2ML IJ SOLN: 4.0000 mg | Freq: Once | INTRAMUSCULAR | Status: DC | PRN

## 2021-02-20 MED ORDER — DIPHENHYDRAMINE HCL 50 MG/ML IJ SOLN: 12.5000 mg | Freq: Four times a day (QID) | INTRAMUSCULAR | Status: DC | PRN

## 2021-02-20 MED ORDER — SUCCINYLCHOLINE CHLORIDE 200 MG/10ML IV SOSY
PREFILLED_SYRINGE | INTRAVENOUS | Status: AC
  Filled 2021-02-20: qty 10

## 2021-02-20 MED ORDER — IRBESARTAN 75 MG PO TABS
75.0000 mg | ORAL_TABLET | Freq: Every day | ORAL | Status: DC
  Administered 2021-02-20 – 2021-02-21 (×2): 75 mg via ORAL
  Filled 2021-02-20 (×2): qty 1

## 2021-02-20 MED ORDER — METRONIDAZOLE IN NACL 5-0.79 MG/ML-% IV SOLN
500.0000 mg | Freq: Once | INTRAVENOUS | Status: AC
  Administered 2021-02-20: 500 mg via INTRAVENOUS
  Filled 2021-02-20: qty 100

## 2021-02-20 MED ORDER — HYDROMORPHONE HCL 1 MG/ML IJ SOLN
0.5000 mg | Freq: Once | INTRAMUSCULAR | Status: AC
  Administered 2021-02-20: 0.5 mg via INTRAVENOUS
  Filled 2021-02-20: qty 1

## 2021-02-20 MED ORDER — BUPIVACAINE-EPINEPHRINE (PF) 0.25% -1:200000 IJ SOLN
INTRAMUSCULAR | Status: AC
  Filled 2021-02-20: qty 30

## 2021-02-20 MED ORDER — SIMETHICONE 80 MG PO CHEW
40.0000 mg | CHEWABLE_TABLET | Freq: Four times a day (QID) | ORAL | Status: DC | PRN
  Administered 2021-02-20: 40 mg via ORAL
  Filled 2021-02-20: qty 1

## 2021-02-20 MED ORDER — PROPOFOL 10 MG/ML IV BOLUS
INTRAVENOUS | Status: AC
  Filled 2021-02-20: qty 40

## 2021-02-20 MED ORDER — ALPRAZOLAM 0.25 MG PO TABS
0.2500 mg | ORAL_TABLET | Freq: Three times a day (TID) | ORAL | Status: DC | PRN
Start: 2021-02-20 — End: 2021-02-21
  Administered 2021-02-20: 0.5 mg via ORAL
  Filled 2021-02-20: qty 2

## 2021-02-20 MED ORDER — FENTANYL CITRATE (PF) 100 MCG/2ML IJ SOLN
25.0000 ug | INTRAMUSCULAR | Status: DC | PRN
  Administered 2021-02-20: 50 ug via INTRAVENOUS

## 2021-02-20 MED ORDER — ESCITALOPRAM OXALATE 10 MG PO TABS: 15.0000 mg | ORAL_TABLET | Freq: Every day | ORAL | Status: DC

## 2021-02-20 MED ORDER — HYDROCHLOROTHIAZIDE 25 MG PO TABS: 25.0000 mg | ORAL_TABLET | Freq: Every day | ORAL | Status: DC

## 2021-02-20 MED ORDER — ONDANSETRON 4 MG PO TBDP: 4.0000 mg | ORAL_TABLET | Freq: Four times a day (QID) | ORAL | Status: DC | PRN

## 2021-02-20 MED ORDER — METRONIDAZOLE IN NACL 5-0.79 MG/ML-% IV SOLN: 500.0000 mg | Freq: Three times a day (TID) | INTRAVENOUS | Status: DC

## 2021-02-20 MED ORDER — OXYCODONE HCL 5 MG PO TABS
5.0000 mg | ORAL_TABLET | ORAL | Status: DC | PRN
  Administered 2021-02-20: 10 mg via ORAL
  Administered 2021-02-20: 5 mg via ORAL
  Administered 2021-02-21 (×2): 10 mg via ORAL
  Filled 2021-02-20: qty 2
  Filled 2021-02-20: qty 1
  Filled 2021-02-20 (×2): qty 2

## 2021-02-20 MED ORDER — SUGAMMADEX SODIUM 200 MG/2ML IV SOLN
INTRAVENOUS | Status: DC | PRN
  Administered 2021-02-20: 200 mg via INTRAVENOUS

## 2021-02-20 MED ORDER — MORPHINE SULFATE (PF) 2 MG/ML IV SOLN: 2.0000 mg | INTRAVENOUS | Status: DC | PRN

## 2021-02-20 MED ORDER — ONDANSETRON HCL 4 MG/2ML IJ SOLN
INTRAMUSCULAR | Status: DC | PRN
  Administered 2021-02-20: 4 mg via INTRAVENOUS

## 2021-02-20 MED ORDER — SUFENTANIL CITRATE 50 MCG/ML IV SOLN
INTRAVENOUS | Status: AC
  Filled 2021-02-20: qty 1

## 2021-02-20 MED ORDER — KETOROLAC TROMETHAMINE 30 MG/ML IJ SOLN: 30.0000 mg | Freq: Four times a day (QID) | INTRAMUSCULAR | Status: DC | PRN

## 2021-02-20 MED ORDER — CYCLOSPORINE 0.05 % OP EMUL
1.0000 [drp] | Freq: Two times a day (BID) | OPHTHALMIC | Status: DC
  Filled 2021-02-20 (×2): qty 1

## 2021-02-20 MED ORDER — ACETAMINOPHEN 500 MG PO TABS
1000.0000 mg | ORAL_TABLET | Freq: Four times a day (QID) | ORAL | Status: DC
  Administered 2021-02-20 – 2021-02-21 (×4): 1000 mg via ORAL
  Filled 2021-02-20 (×4): qty 2

## 2021-02-20 MED ORDER — SODIUM CHLORIDE 0.9 % IV SOLN: 2.0000 g | INTRAVENOUS | Status: DC

## 2021-02-20 MED ORDER — TRAMADOL HCL 50 MG PO TABS: 50.0000 mg | ORAL_TABLET | Freq: Four times a day (QID) | ORAL | Status: DC | PRN

## 2021-02-20 MED ORDER — LABETALOL HCL 5 MG/ML IV SOLN
INTRAVENOUS | Status: AC
  Administered 2021-02-20: 5 mg via INTRAVENOUS
  Filled 2021-02-20: qty 4

## 2021-02-20 MED ORDER — KETOROLAC TROMETHAMINE 30 MG/ML IJ SOLN
INTRAMUSCULAR | Status: DC | PRN
  Administered 2021-02-20: 30 mg via INTRAVENOUS

## 2021-02-20 MED ORDER — ONDANSETRON HCL 4 MG/2ML IJ SOLN
INTRAMUSCULAR | Status: AC
  Filled 2021-02-20: qty 2

## 2021-02-20 MED ORDER — BUPIVACAINE-EPINEPHRINE 0.25% -1:200000 IJ SOLN
INTRAMUSCULAR | Status: DC | PRN
  Administered 2021-02-20: 30 mL

## 2021-02-20 MED ORDER — HYDROMORPHONE HCL 1 MG/ML IJ SOLN
INTRAMUSCULAR | Status: AC
  Administered 2021-02-20: 0.5 mg via INTRAVENOUS
  Filled 2021-02-20: qty 1

## 2021-02-20 MED ORDER — SIMETHICONE 80 MG PO CHEW: 40.0000 mg | CHEWABLE_TABLET | Freq: Four times a day (QID) | ORAL | Status: DC | PRN

## 2021-02-20 MED ORDER — ALPRAZOLAM 0.25 MG PO TABS: 0.2500 mg | ORAL_TABLET | Freq: Every day | ORAL | Status: DC | PRN

## 2021-02-20 MED ORDER — DEXTROSE-NACL 5-0.45 % IV SOLN
INTRAVENOUS | Status: DC
  Administered 2021-02-20: 1000 mL via INTRAVENOUS

## 2021-02-20 MED ORDER — IBUPROFEN 400 MG PO TABS: 600.0000 mg | ORAL_TABLET | Freq: Four times a day (QID) | ORAL | Status: DC | PRN

## 2021-02-20 MED ORDER — BUPROPION HCL ER (XL) 150 MG PO TB24
150.0000 mg | ORAL_TABLET | Freq: Every day | ORAL | Status: DC
  Filled 2021-02-20: qty 1

## 2021-02-20 MED ORDER — METOCLOPRAMIDE HCL 5 MG/ML IJ SOLN
10.0000 mg | Freq: Once | INTRAMUSCULAR | Status: AC
  Administered 2021-02-20: 10 mg via INTRAVENOUS
  Filled 2021-02-20: qty 2

## 2021-02-20 MED ORDER — ENOXAPARIN SODIUM 40 MG/0.4ML ~~LOC~~ SOLN
40.0000 mg | SUBCUTANEOUS | Status: DC
  Administered 2021-02-21: 40 mg via SUBCUTANEOUS
  Filled 2021-02-20: qty 0.4

## 2021-02-20 MED ORDER — PROPOFOL 10 MG/ML IV BOLUS
INTRAVENOUS | Status: DC | PRN
  Administered 2021-02-20: 180 mg via INTRAVENOUS

## 2021-02-20 MED ORDER — ENOXAPARIN SODIUM 40 MG/0.4ML ~~LOC~~ SOLN: 40.0000 mg | SUBCUTANEOUS | Status: DC

## 2021-02-20 MED ORDER — HYDRALAZINE HCL 20 MG/ML IJ SOLN: 10.0000 mg | INTRAMUSCULAR | Status: DC | PRN

## 2021-02-20 MED ORDER — LIDOCAINE 2% (20 MG/ML) 5 ML SYRINGE
INTRAMUSCULAR | Status: AC
  Filled 2021-02-20: qty 5

## 2021-02-20 MED ORDER — ACETAMINOPHEN 10 MG/ML IV SOLN
INTRAVENOUS | Status: DC | PRN
  Administered 2021-02-20: 1000 mg via INTRAVENOUS

## 2021-02-20 MED ORDER — ZOLPIDEM TARTRATE 5 MG PO TABS: 5.0000 mg | ORAL_TABLET | Freq: Every evening | ORAL | Status: DC | PRN

## 2021-02-20 MED ORDER — HYDROMORPHONE HCL 1 MG/ML IJ SOLN: 0.5000 mg | INTRAMUSCULAR | Status: DC | PRN

## 2021-02-20 MED ORDER — ZOLPIDEM TARTRATE 5 MG PO TABS
5.0000 mg | ORAL_TABLET | Freq: Every evening | ORAL | Status: DC | PRN
  Administered 2021-02-20: 5 mg via ORAL
  Filled 2021-02-20: qty 1

## 2021-02-20 MED ORDER — DEXAMETHASONE SODIUM PHOSPHATE 10 MG/ML IJ SOLN
INTRAMUSCULAR | Status: DC | PRN
  Administered 2021-02-20: 10 mg via INTRAVENOUS

## 2021-02-20 MED ORDER — LACTATED RINGERS IV SOLN: INTRAVENOUS | Status: DC | PRN

## 2021-02-20 MED ORDER — SUFENTANIL CITRATE 50 MCG/ML IV SOLN
INTRAVENOUS | Status: DC | PRN
  Administered 2021-02-20 (×2): 10 ug via INTRAVENOUS

## 2021-02-20 MED ORDER — IOHEXOL 300 MG/ML  SOLN
100.0000 mL | Freq: Once | INTRAMUSCULAR | Status: AC | PRN
  Administered 2021-02-20: 100 mL via INTRAVENOUS

## 2021-02-20 MED ORDER — BUPROPION HCL ER (XL) 150 MG PO TB24: 150.0000 mg | ORAL_TABLET | Freq: Every morning | ORAL | Status: DC

## 2021-02-20 MED ORDER — METOPROLOL TARTRATE 5 MG/5ML IV SOLN: 5.0000 mg | Freq: Four times a day (QID) | INTRAVENOUS | Status: DC | PRN

## 2021-02-20 MED ORDER — ESCITALOPRAM OXALATE 10 MG PO TABS
10.0000 mg | ORAL_TABLET | Freq: Every day | ORAL | Status: DC
  Administered 2021-02-21: 10 mg via ORAL
  Filled 2021-02-20: qty 1

## 2021-02-20 MED ORDER — ROCURONIUM 10MG/ML (10ML) SYRINGE FOR MEDFUSION PUMP - OPTIME
INTRAVENOUS | Status: DC | PRN
  Administered 2021-02-20: 50 mg via INTRAVENOUS

## 2021-02-20 MED ORDER — FENTANYL CITRATE (PF) 100 MCG/2ML IJ SOLN
INTRAMUSCULAR | Status: AC
  Administered 2021-02-20: 50 ug via INTRAVENOUS
  Filled 2021-02-20: qty 2

## 2021-02-20 MED ORDER — IRBESARTAN 75 MG PO TABS
75.0000 mg | ORAL_TABLET | Freq: Every day | ORAL | Status: DC
  Filled 2021-02-20: qty 1

## 2021-02-20 MED ORDER — ONDANSETRON HCL 4 MG/2ML IJ SOLN: 4.0000 mg | Freq: Four times a day (QID) | INTRAMUSCULAR | Status: DC | PRN

## 2021-02-20 MED ORDER — SUCCINYLCHOLINE 20MG/ML (10ML) SYRINGE FOR MEDFUSION PUMP - OPTIME
INTRAMUSCULAR | Status: DC | PRN
  Administered 2021-02-20: 140 mg via INTRAVENOUS

## 2021-02-20 MED ORDER — SODIUM CHLORIDE (PF) 0.9 % IJ SOLN
INTRAMUSCULAR | Status: AC
  Filled 2021-02-20: qty 10

## 2021-02-20 MED ORDER — LABETALOL HCL 5 MG/ML IV SOLN
5.0000 mg | INTRAVENOUS | Status: AC | PRN
  Administered 2021-02-20 (×3): 5 mg via INTRAVENOUS

## 2021-02-20 MED ORDER — HYDROCHLOROTHIAZIDE 25 MG PO TABS
25.0000 mg | ORAL_TABLET | Freq: Every day | ORAL | Status: DC
  Administered 2021-02-20 – 2021-02-21 (×2): 25 mg via ORAL
  Filled 2021-02-20 (×2): qty 1

## 2021-02-20 MED ORDER — DIPHENHYDRAMINE HCL 12.5 MG/5ML PO ELIX: 12.5000 mg | ORAL_SOLUTION | Freq: Four times a day (QID) | ORAL | Status: DC | PRN

## 2021-02-20 MED ORDER — 0.9 % SODIUM CHLORIDE (POUR BTL) OPTIME
TOPICAL | Status: DC | PRN
  Administered 2021-02-20: 1000 mL

## 2021-02-20 MED ORDER — ACETAMINOPHEN 10 MG/ML IV SOLN
INTRAVENOUS | Status: AC
  Filled 2021-02-20: qty 100

## 2021-02-20 MED ORDER — DIPHENHYDRAMINE HCL 50 MG/ML IJ SOLN
12.5000 mg | Freq: Once | INTRAMUSCULAR | Status: AC
  Administered 2021-02-20: 12.5 mg via INTRAVENOUS
  Filled 2021-02-20: qty 1

## 2021-02-20 MED ORDER — LORAZEPAM 2 MG/ML IJ SOLN
0.5000 mg | Freq: Once | INTRAMUSCULAR | Status: AC
  Administered 2021-02-20: 0.5 mg via INTRAVENOUS
  Filled 2021-02-20: qty 1

## 2021-02-20 MED ORDER — OXYCODONE HCL 5 MG PO TABS
5.0000 mg | ORAL_TABLET | Freq: Once | ORAL | Status: DC | PRN
Start: 2021-02-20 — End: 2021-02-20

## 2021-02-20 MED ORDER — MIDAZOLAM HCL 2 MG/2ML IJ SOLN
INTRAMUSCULAR | Status: AC
  Filled 2021-02-20: qty 2

## 2021-02-20 MED ORDER — ACETAMINOPHEN 500 MG PO TABS: 1000.0000 mg | ORAL_TABLET | Freq: Four times a day (QID) | ORAL | Status: DC

## 2021-02-20 MED ORDER — LIDOCAINE HCL (CARDIAC) PF 100 MG/5ML IV SOSY
PREFILLED_SYRINGE | INTRAVENOUS | Status: DC | PRN
  Administered 2021-02-20: 100 mg via INTRAVENOUS

## 2021-02-20 MED ORDER — CHLORHEXIDINE GLUCONATE 0.12 % MT SOLN
15.0000 mL | Freq: Once | OROMUCOSAL | Status: AC
  Filled 2021-02-20: qty 15

## 2021-02-20 MED ORDER — OXYCODONE HCL 5 MG/5ML PO SOLN: 5.0000 mg | Freq: Once | ORAL | Status: DC | PRN

## 2021-02-20 SURGICAL SUPPLY — 39 items
ADH SKN CLS APL DERMABOND .7 (GAUZE/BANDAGES/DRESSINGS) ×1
APL PRP STRL LF DISP 70% ISPRP (MISCELLANEOUS) ×1
BAG SPEC RTRVL 10 TROC 200 (ENDOMECHANICALS) ×1
CANISTER SUCT 3000ML PPV (MISCELLANEOUS) ×2 IMPLANT
CHLORAPREP W/TINT 26 (MISCELLANEOUS) ×2 IMPLANT
CLIP VESOLOCK MED LG 6/CT (CLIP) ×1 IMPLANT
CLIP VESOLOCK XL 6/CT (CLIP) ×2 IMPLANT
COVER SURGICAL LIGHT HANDLE (MISCELLANEOUS) ×2 IMPLANT
CUTTER FLEX LINEAR 45M (STAPLE) IMPLANT
DERMABOND ADVANCED (GAUZE/BANDAGES/DRESSINGS) ×1
DERMABOND ADVANCED .7 DNX12 (GAUZE/BANDAGES/DRESSINGS) ×1 IMPLANT
ELECT REM PT RETURN 9FT ADLT (ELECTROSURGICAL) ×2
ELECTRODE REM PT RTRN 9FT ADLT (ELECTROSURGICAL) ×1 IMPLANT
GLOVE SURG SS PI 7.0 STRL IVOR (GLOVE) ×2 IMPLANT
GLOVE SURG UNDER POLY LF SZ7 (GLOVE) ×2 IMPLANT
GOWN STRL REUS W/ TWL LRG LVL3 (GOWN DISPOSABLE) ×3 IMPLANT
GOWN STRL REUS W/TWL LRG LVL3 (GOWN DISPOSABLE) ×6
GRASPER SUT TROCAR 14GX15 (MISCELLANEOUS) ×2 IMPLANT
KIT BASIN OR (CUSTOM PROCEDURE TRAY) ×2 IMPLANT
KIT TURNOVER KIT B (KITS) ×2 IMPLANT
NEEDLE 22X1 1/2 (OR ONLY) (NEEDLE) ×2 IMPLANT
NS IRRIG 1000ML POUR BTL (IV SOLUTION) ×2 IMPLANT
PAD ARMBOARD 7.5X6 YLW CONV (MISCELLANEOUS) ×4 IMPLANT
POUCH RETRIEVAL ECOSAC 10 (ENDOMECHANICALS) ×1 IMPLANT
POUCH RETRIEVAL ECOSAC 10MM (ENDOMECHANICALS) ×2
RELOAD STAPLE 45 3.5 BLU ETS (ENDOMECHANICALS) IMPLANT
RELOAD STAPLE TA45 3.5 REG BLU (ENDOMECHANICALS) IMPLANT
SCISSORS LAP 5X35 DISP (ENDOMECHANICALS) ×2 IMPLANT
SET IRRIG TUBING LAPAROSCOPIC (IRRIGATION / IRRIGATOR) ×2 IMPLANT
SET TUBE SMOKE EVAC HIGH FLOW (TUBING) ×2 IMPLANT
SLEEVE ENDOPATH XCEL 5M (ENDOMECHANICALS) ×2 IMPLANT
SPECIMEN JAR SMALL (MISCELLANEOUS) ×2 IMPLANT
SUT MNCRL AB 4-0 PS2 18 (SUTURE) ×2 IMPLANT
TOWEL GREEN STERILE (TOWEL DISPOSABLE) ×2 IMPLANT
TOWEL GREEN STERILE FF (TOWEL DISPOSABLE) ×2 IMPLANT
TRAY LAPAROSCOPIC MC (CUSTOM PROCEDURE TRAY) ×2 IMPLANT
TROCAR XCEL 12X100 BLDLESS (ENDOMECHANICALS) ×2 IMPLANT
TROCAR XCEL NON-BLD 5MMX100MML (ENDOMECHANICALS) ×2 IMPLANT
WATER STERILE IRR 1000ML POUR (IV SOLUTION) ×2 IMPLANT

## 2021-02-20 NOTE — Anesthesia Procedure Notes (Signed)
Procedure Name: Intubation Date/Time: 02/20/2021 9:20 AM Performed by: Claris Che, CRNA Pre-anesthesia Checklist: Patient identified, Emergency Drugs available, Suction available, Patient being monitored and Timeout performed Patient Re-evaluated:Patient Re-evaluated prior to induction Oxygen Delivery Method: Circle system utilized Preoxygenation: Pre-oxygenation with 100% oxygen Induction Type: IV induction, Rapid sequence and Cricoid Pressure applied Ventilation: Mask ventilation without difficulty Laryngoscope Size: Mac and 4 Grade View: Grade I Tube type: Oral Tube size: 7.5 mm Number of attempts: 1 Airway Equipment and Method: Stylet Placement Confirmation: ETT inserted through vocal cords under direct vision,  positive ETCO2 and breath sounds checked- equal and bilateral Secured at: 23 cm Tube secured with: Tape Dental Injury: Teeth and Oropharynx as per pre-operative assessment

## 2021-02-20 NOTE — H&P (Signed)
CC: RLQ Pain  Requesting provider:   HPI: [name] is an 45 y.o. female who is here for abdominal pain. Pain started yesterday morning. It is constant. Worst in the right lower quadrant. It does not radiate or move. It is sharp. It is worse with movement. Pain medications help some. She has nausea and diarrhea. She denies fevers.  Past Medical History:  Diagnosis Date  . ALLERGIC RHINITIS   . HYPERTENSION   . OVARIAN CYST   . Unspecified Anemia     Past Surgical History:  Procedure Laterality Date  . NO PAST SURGERIES      Family History  Problem Relation Age of Onset  . Hypertension Mother   . Hyperlipidemia Mother   . Diabetes Mother   . Cancer Father        Prostate  . Hypertension Sister   . Diabetes Cousin     Social:  reports that she has never smoked. She has never used smokeless tobacco. She reports that she does not drink alcohol and does not use drugs.  Allergies:  Allergies  Allergen Reactions  . Buspirone     Gave her panic attacks  . Penicillins   . Doxycycline Itching and Rash  . Gluten Meal Anxiety, Nausea Only, Other (See Comments) and Swelling  . Lactase Itching and Rash    Medications: I have reviewed the patient's current medications.  Results for orders placed or performed during the hospital encounter of 02/19/21 (from the past 48 hour(s))  Urinalysis, Routine w reflex microscopic Urine, Clean Catch     Status: Abnormal   Collection Time: 02/19/21  9:36 PM  Result Value Ref Range   Color, Urine YELLOW YELLOW   APPearance CLEAR CLEAR   Specific Gravity, Urine 1.020 1.005 - 1.030   pH 9.0 (H) 5.0 - 8.0   Glucose, UA NEGATIVE NEGATIVE mg/dL   Hgb urine dipstick NEGATIVE NEGATIVE   Bilirubin Urine NEGATIVE NEGATIVE   Ketones, ur 80 (A) NEGATIVE mg/dL   Protein, ur 30 (A) NEGATIVE mg/dL   Nitrite NEGATIVE NEGATIVE   Leukocytes,Ua NEGATIVE NEGATIVE   RBC / HPF 0-5 0 - 5 RBC/hpf   WBC, UA 0-5 0 - 5 WBC/hpf   Bacteria, UA RARE (A)  NONE SEEN   Squamous Epithelial / LPF 0-5 0 - 5   Mucus PRESENT     Comment: Performed at [facility], [address] [place], [place] [id number]  CBC with Differential     Status: Abnormal   Collection Time: 02/19/21  9:39 PM  Result Value Ref Range   WBC 15.9 (H) 4.0 - 10.5 K/uL   RBC 4.43 3.87 - 5.11 MIL/uL   Hemoglobin 12.1 12.0 - 15.0 g/dL   HCT 91.437.8 78.236.0 - 95.646.0 %   MCV 85.3 80.0 - 100.0 fL   MCH 27.3 26.0 - 34.0 pg   MCHC 32.0 30.0 - 36.0 g/dL   RDW 21.314.2 08.611.5 - 57.815.5 %   Platelets 283 150 - 400 K/uL   nRBC 0.0 0.0 - 0.2 %   Neutrophils Relative % 91 %   Neutro Abs 14.3 (H) 1.7 - 7.7 K/uL   Lymphocytes Relative 6 %   Lymphs Abs 1.0 0.7 - 4.0 K/uL   Monocytes Relative 3 %   Monocytes Absolute 0.5 0.1 - 1.0 K/uL   Eosinophils Relative 0 %   Eosinophils Absolute 0.0 0.0 - 0.5 K/uL   Basophils Relative 0 %   Basophils Absolute 0.0 0.0 - 0.1 K/uL  Immature Granulocytes 0 %   Abs Immature Granulocytes 0.06 0.00 - 0.07 K/uL    Comment: Performed at [facility], [address] [place], [place] [address]  Comprehensive metabolic panel     Status: Abnormal   Collection Time: 02/19/21  9:39 PM  Result Value Ref Range   Sodium 134 (L) 135 - 145 mmol/L   Potassium 3.5 3.5 - 5.1 mmol/L   Chloride 101 98 - 111 mmol/L   CO2 23 22 - 32 mmol/L   Glucose, Bld 128 (H) 70 - 99 mg/dL    Comment: Glucose reference range applies only to samples taken after fasting for at least 8 hours.   BUN 10 6 - 20 mg/dL   Creatinine, Ser 6.04 0.44 - 1.00 mg/dL   Calcium 9.2 8.9 - 54.0 mg/dL   Total Protein 7.5 6.5 - 8.1 g/dL   Albumin 4.1 3.5 - 5.0 g/dL   AST 19 15 - 41 U/L   ALT 24 0 - 44 U/L   Alkaline Phosphatase 58 38 - 126 U/L   Total Bilirubin 0.3 0.3 - 1.2 mg/dL   GFR, Estimated >98 >11 mL/min    Comment: (NOTE) Calculated using the CKD-EPI Creatinine Equation (2021)    Anion gap 10 5 - 15    Comment: Performed at [facility], [address] [place], [place]  [address]  Lipase, blood     Status: None   Collection Time: 02/19/21  9:39 PM  Result Value Ref Range   Lipase 23 11 - 51 U/L    Comment: Performed at [facility], [address] [place], [place] [address]  POC beta hCG blood, ED     Status: Abnormal   Collection Time: 02/19/21  9:50 PM  Result Value Ref Range   I-stat hCG, quantitative 8.7 (H) <5 mIU/mL   Comment 3            Comment:   GEST. AGE      CONC.  (mIU/mL)   <=1 WEEK        5 - 50     2 WEEKS       50 - 500     3 WEEKS       100 - 10,000     4 WEEKS     1,000 - 30,000        FEMALE AND NON-PREGNANT FEMALE:     LESS THAN 5 mIU/mL   Pregnancy, urine     Status: None   Collection Time: 02/20/21  2:33 AM  Result Value Ref Range   Preg Test, Ur NEGATIVE NEGATIVE    Comment: Performed at [facility], [address] [place], [place] [address]    CT ABDOMEN PELVIS W CONTRAST  Result Date: 02/20/2021 CLINICAL DATA:  Right lower quadrant pain with appendicitis suspected. EXAM: CT ABDOMEN AND PELVIS WITH CONTRAST TECHNIQUE: Multidetector CT imaging of the abdomen and pelvis was performed using the standard protocol following bolus administration of intravenous contrast. CONTRAST:  OMNIPAQUE IOHEXOL 300 MG/ML  SOLN COMPARISON:  None. FINDINGS: Lower chest:  No contributory findings. Hepatobiliary: No focal liver abnormality.No evidence of biliary obstruction or stone. Pancreas: Unremarkable. Spleen: Unremarkable. Adrenals/Urinary Tract: 14 mm right adrenal nodule with homogeneous appearance. No chart history of malignancy. Tiny bilateral renal cystic densities. No hydronephrosis or stone. Unremarkable bladder. Stomach/Bowel: Thick wall appendix with mesoappendiceal stranding. Maximal appendiceal thickness is 19 mm. An appendicolith is present at the base.  The cecum shows edema. No perforation or abscess noted. Vascular/Lymphatic: No acute vascular abnormality. No mass or adenopathy. Reproductive:No pathologic findings.   Corpus luteum on the left. Other: Trace reactive appearing pelvic fluid. Musculoskeletal: No acute abnormalities. IMPRESSION: 1. Acute appendicitis without perforation. An appendicolith is present. 2. 14 mm right adrenal nodule, statistically an adenoma. Electronically Signed   By: Marnee Spring M.D.   On: 02/20/2021 04:18    ROS - all of the below systems have been reviewed with the patient and positives are indicated with bold text General: chills, fever or night sweats Eyes: blurry vision or double vision ENT: epistaxis or sore throat Allergy/Immunology: itchy/watery eyes or nasal congestion Hematologic/Lymphatic: bleeding problems, blood clots or swollen lymph nodes Endocrine: temperature intolerance or unexpected weight changes Breast: new or changing breast lumps or nipple discharge Resp: cough, shortness of breath, or wheezing CV: chest pain or dyspnea on exertion GI: as per HPI GU: dysuria, trouble voiding, or hematuria MSK: joint pain or joint stiffness Neuro: TIA or stroke symptoms Derm: pruritus and skin lesion changes Psych: anxiety and depression  PE Blood pressure (!) 128/99, pulse (!) 117, temperature 99.1 F (37.3 C), temperature source Oral, resp. rate (!) 29, height 5\' 3"  (1.6 m), weight 78.5 kg, last menstrual period 02/07/2021, SpO2 98 %. Constitutional: NAD; conversant; no deformities Eyes: Moist conjunctiva; no lid lag; anicteric; PERRL Neck: Trachea midline; no thyromegaly Lungs: Normal respiratory effort; no tactile fremitus CV: RRR; no palpable thrills; no pitting edema GI: Abd tender RLQ, no guarding; no palpable hepatosplenomegaly MSK: Normal range of motion of extremities; no clubbing/cyanosis Psychiatric: Appropriate affect; alert and oriented x3 Lymphatic: No palpable cervical or axillary lymphadenopathy  Results for orders placed or performed during the hospital encounter of 02/19/21 (from the past 48 hour(s))  Urinalysis, Routine w reflex  microscopic Urine, Clean Catch     Status: Abnormal   Collection Time: 02/19/21  9:36 PM  Result Value Ref Range   Color, Urine YELLOW YELLOW   APPearance CLEAR CLEAR   Specific Gravity, Urine 1.020 1.005 - 1.030   pH 9.0 (H) 5.0 - 8.0   Glucose, UA NEGATIVE NEGATIVE mg/dL   Hgb urine dipstick NEGATIVE NEGATIVE   Bilirubin Urine NEGATIVE NEGATIVE   Ketones, ur 80 (A) NEGATIVE mg/dL   Protein, ur 30 (A) NEGATIVE mg/dL   Nitrite NEGATIVE NEGATIVE   Leukocytes,Ua NEGATIVE NEGATIVE   RBC / HPF 0-5 0 - 5 RBC/hpf   WBC, UA 0-5 0 - 5 WBC/hpf   Bacteria, UA RARE (A) NONE SEEN   Squamous Epithelial / LPF 0-5 0 - 5   Mucus PRESENT     Comment: Performed at [facility], [address] [place], [place]  CBC with Differential     Status: Abnormal   Collection Time: 02/19/21  9:39 PM  Result Value Ref Range   WBC 15.9 (H) 4.0 - 10.5 K/uL   RBC 4.43 3.87 - 5.11 MIL/uL   Hemoglobin 12.1 12.0 - 15.0 g/dL   HCT 02/21/21 00.9 - 38.1 %   MCV 85.3 80.0 - 100.0 fL   MCH 27.3 26.0 - 34.0 pg   MCHC 32.0 30.0 - 36.0 g/dL   RDW 82.9 93.7 - 16.9 %   Platelets 283 150 - 400 K/uL   nRBC 0.0 0.0 - 0.2 %   Neutrophils Relative % 91 %   Neutro Abs 14.3 (H) 1.7 - 7.7 K/uL   Lymphocytes Relative 6 %   Lymphs Abs 1.0  0.7 - 4.0 K/uL   Monocytes Relative 3 %   Monocytes Absolute 0.5 0.1 - 1.0 K/uL   Eosinophils Relative 0 %   Eosinophils Absolute 0.0 0.0 - 0.5 K/uL   Basophils Relative 0 %   Basophils Absolute 0.0 0.0 - 0.1 K/uL   Immature Granulocytes 0 %   Abs Immature Granulocytes 0.06 0.00 - 0.07 K/uL    Comment: Performed at [facility], [address] [place], [place] [address]  Comprehensive metabolic panel     Status: Abnormal   Collection Time: 02/19/21  9:39 PM  Result Value Ref Range   Sodium 134 (L) 135 - 145 mmol/L   Potassium 3.5 3.5 - 5.1 mmol/L   Chloride 101 98 - 111 mmol/L   CO2 23 22 - 32 mmol/L   Glucose, Bld 128 (H) 70 - 99 mg/dL    Comment: Glucose  reference range applies only to samples taken after fasting for at least 8 hours.   BUN 10 6 - 20 mg/dL   Creatinine, Ser 2.99 0.44 - 1.00 mg/dL   Calcium 9.2 8.9 - 24.2 mg/dL   Total Protein 7.5 6.5 - 8.1 g/dL   Albumin 4.1 3.5 - 5.0 g/dL   AST 19 15 - 41 U/L   ALT 24 0 - 44 U/L   Alkaline Phosphatase 58 38 - 126 U/L   Total Bilirubin 0.3 0.3 - 1.2 mg/dL   GFR, Estimated >68 >34 mL/min    Comment: (NOTE) Calculated using the CKD-EPI Creatinine Equation (2021)    Anion gap 10 5 - 15    Comment: Performed at [facility], [address] [place], [place] [address]  Lipase, blood     Status: None   Collection Time: 02/19/21  9:39 PM  Result Value Ref Range   Lipase 23 11 - 51 U/L    Comment: Performed at [facility], [address], [place], [place] [address]  POC beta hCG blood, ED     Status: Abnormal   Collection Time: 02/19/21  9:50 PM  Result Value Ref Range   I-stat hCG, quantitative 8.7 (H) <5 mIU/mL   Comment 3            Comment:   GEST. AGE      CONC.  (mIU/mL)   <=1 WEEK        5 - 50     2 WEEKS       50 - 500     3 WEEKS       100 - 10,000     4 WEEKS     1,000 - 30,000        FEMALE AND NON-PREGNANT FEMALE:     LESS THAN 5 mIU/mL   Pregnancy, urine     Status: None   Collection Time: 02/20/21  2:33 AM  Result Value Ref Range   Preg Test, Ur NEGATIVE NEGATIVE    Comment: Performed at [facility], [address], [place], [place] [address]    CT ABDOMEN PELVIS W CONTRAST  Result Date: 02/20/2021 CLINICAL DATA:  Right lower quadrant pain with appendicitis suspected. EXAM: CT ABDOMEN AND PELVIS WITH CONTRAST TECHNIQUE: Multidetector CT imaging of the abdomen and pelvis was performed using the standard protocol following bolus administration of intravenous contrast. CONTRAST:  OMNIPAQUE IOHEXOL 300 MG/ML  SOLN COMPARISON:  None. FINDINGS: Lower chest:  No contributory findings. Hepatobiliary: No focal liver abnormality.No evidence of biliary  obstruction  or stone. Pancreas: Unremarkable. Spleen: Unremarkable. Adrenals/Urinary Tract: 14 mm right adrenal nodule with homogeneous appearance. No chart history of malignancy. Tiny bilateral renal cystic densities. No hydronephrosis or stone. Unremarkable bladder. Stomach/Bowel: Thick wall appendix with mesoappendiceal stranding. Maximal appendiceal thickness is 19 mm. An appendicolith is present at the base. The cecum shows edema. No perforation or abscess noted. Vascular/Lymphatic: No acute vascular abnormality. No mass or adenopathy. Reproductive:No pathologic findings.  Corpus luteum on the left. Other: Trace reactive appearing pelvic fluid. Musculoskeletal: No acute abnormalities. IMPRESSION: 1. Acute appendicitis without perforation. An appendicolith is present. 2. 14 mm right adrenal nodule, statistically an adenoma. Electronically Signed   By: Marnee Spring M.D.   On: 02/20/2021 04:18    A/P: Louie Meaders is an 45 y.o. female with acute appendicitis. -we discussed the details of the procedure; that it would be done under general anesthesia, that we would attempt to do the procedure laparoscopically. That the appendix would be isolated from the large and small intestine and then ligated and removed. We discussed the reason for this is to avoid rupture and infection and resolve the pains. We discussed risks of infection, abscess, injury to intestines or urinary structures, and need for open incision. She showed good understanding and wanted to proceed. -IV abx -OR this morning  Marin Olp, MD [facility] Use [email or web address] to contact on call provider

## 2021-02-20 NOTE — Op Note (Signed)
Procedure(s): APPENDECTOMY LAPAROSCOPIC Procedure Note  [name] female 45 y.o. 02/20/2021  Procedure(s) and Anesthesia Type:    * APPENDECTOMY LAPAROSCOPIC - General  Surgeon(s) and Role:    * Kinsinger, De Blanch, MD - Primary    * Isabel Caprice, MD - Resident-Assisting   Indications:  The patient is a 45 year old female who presented to the emergency department early this morning with a 1 day history of abdominal pain worse in the right lower quadrant.  The pain was also associated with nausea and diarrhea.  She denied fevers.  Upon evaluation she was found to have a white blood count of 16 and CT abdomen pelvis confirmed the clinical suspicion for acute appendicitis.  Therefore decision was made to proceed with laparoscopic appendectomy.     Surgeon: De Blanch Kinsinger  Assistants: Isabel Caprice   Anesthesia: General endotracheal anesthesia  ASA Class: 3  Procedure Detail  APPENDECTOMY LAPAROSCOPIC  After informed consent was obtained, the patient was brought to the operating room and positioned on the operating table in the supine position with the left arm tucked.  Preoperative antibiotics were administered.  The abdomen was then prepped and draped in standard sterile fashion.  A surgical timeout was performed following standard institutional protocol.  We started with a 12 mm incision just to the left of the umbilicus.  We entered the abdominal cavity under direct visualization in Optiview technique.  Pneumoperitoneum was achieved to insufflation pressure of 15 mmHg.  Diagnostic laparoscopy was performed that did not reveal any injury from entering the abdomen.  2 additional 5 mm ports were then placed, 1 in the suprapubic position and 1 in the left lower quadrant.  The 5 mm port to the left of the umbilicus was then upsized to a 12 mm port. Bilateral TAP blocks were then performed using 0.25% Marcaine.  We then turned our attention to the right lower quadrant.   The appendix was easily identified.  The appendix was thickened and showed clear signs of acute appendicitis without perforation. We started by creating a window right at the base of the appendix using blunt dissection. Once that was accomplished, the appendiceal artery was then dissected out with the hook electrocautery and clipped with hemoclips.  2 clips were placed proximal and the artery was then transected with cautery. Of note, there was some bleeding from the appendiceal artery during the dissection which stopped completely after application of the clips. The base of the appendix was then transected with the single 67mm endo GIA blue staple load.  The appendix was then placed in the laparoscopic specimen retrieval bag and removed through the periumbilical port site.  The right lower quadrant and pelvis were irrigated and hemostasis was confirmed.  The fascia at the periumbilical port site was then closed with a single 0 Vicryl stitch.  Pneumoperitoneum was released and all ports removed from the abdominal cavity.  All skin incisions were closed with 4-0 Monocryl.  Dermabond was applied to the skin.  All needle and instrument counts were correct at the end of the case.  Patient was awakened from anesthesia without any complications and transferred to the PACU in stable condition.  Findings: Acute uncomplicated appendicitis  Estimated Blood Loss:  101mL         Drains: None         Total IV Fluids: see anesthesia record  Blood Given: none          Specimens: appendix  Implants: none        Complications:  * No complications entered in OR log *         Disposition: PACU - hemodynamically stable.         Condition: stable

## 2021-02-20 NOTE — Transfer of Care (Signed)
Immediate Anesthesia Transfer of Care Note  Patient: [name]  Procedure(s) Performed: APPENDECTOMY LAPAROSCOPIC (N/A Abdomen)  Patient Location: PACU  Anesthesia Type:General  Level of Consciousness: oriented, drowsy, patient cooperative and responds to stimulation  Airway & Oxygen Therapy: Patient Spontanous Breathing and Patient connected to nasal cannula oxygen  Post-op Assessment: Report given to RN, Post -op Vital signs reviewed and stable and Patient moving all extremities X 4  Post vital signs: Reviewed and stable  Last Vitals:  Vitals Value Taken Time  BP 144/103 02/20/21 1030  Temp 36.8 C 02/20/21 1030  Pulse 89 02/20/21 1032  Resp 23 02/20/21 1032  SpO2 100 % 02/20/21 1032  Vitals shown include unvalidated device data.  Last Pain:  Vitals:   02/20/21 0748  TempSrc:   PainSc: 3          Complications: No complications documented.

## 2021-02-20 NOTE — Discharge Instructions (Signed)
[facility] ° °Please arrive at least 30 min before your appointment to complete your check in paperwork.  If you are unable to arrive 30 min prior to your appointment time we may have to cancel or reschedule you. °LAPAROSCOPIC SURGERY: POST OP INSTRUCTIONS °Always review your discharge instruction sheet given to you by the facility where your surgery was performed. °IF YOU HAVE DISABILITY OR FAMILY LEAVE FORMS, YOU MUST BRING THEM TO THE OFFICE FOR PROCESSING.   °DO NOT GIVE THEM TO YOUR DOCTOR. ° °PAIN CONTROL ° °1. First take acetaminophen (Tylenol) AND/or ibuprofen (Advil) to control your pain after surgery.  Follow directions on package.  Taking acetaminophen (Tylenol) and/or ibuprofen (Advil) regularly after surgery will help to control your pain and lower the amount of prescription pain medication you may need.  You should not take more than 4,000 mg (4 grams) of acetaminophen (Tylenol) in 24 hours.  You should not take ibuprofen (Advil), aleve, motrin, naprosyn or other NSAIDS if you have a history of stomach ulcers or chronic kidney disease.  °2. A prescription for pain medication may be given to you upon discharge.  Take your pain medication as prescribed, if you still have uncontrolled pain after taking acetaminophen (Tylenol) or ibuprofen (Advil). °3. Use ice packs to help control pain. °4. If you need a refill on your pain medication, please contact your pharmacy.  They will contact our office to request authorization. Prescriptions will not be filled after 5pm or on week-ends. ° °HOME MEDICATIONS °5. Take your usually prescribed medications unless otherwise directed. ° °DIET °6. You should follow a light diet the first few days after arrival home.  Be sure to include lots of fluids daily. Avoid fatty, fried foods.  ° °CONSTIPATION °7. It is common to experience some constipation after surgery and if you are taking pain medication.  Increasing fluid intake and taking a stool  softener (such as Colace) will usually help or prevent this problem from occurring.  A mild laxative (Milk of Magnesia or Miralax) should be taken according to package instructions if there are no bowel movements after 48 hours. ° °WOUND/INCISION CARE °8. Most patients will experience some swelling and bruising in the area of the incisions.  Ice packs will help.  Swelling and bruising can take several days to resolve.  °9. Unless discharge instructions indicate otherwise, follow guidelines below  °a. STERI-STRIPS - you may remove your outer bandages 48 hours after surgery, and you may shower at that time.  You have steri-strips (small skin tapes) in place directly over the incision.  These strips should be left on the skin for 7-10 days.   °b. DERMABOND/SKIN GLUE - you may shower in 24 hours.  The glue will flake off over the next 2-3 weeks. °10. Any sutures or staples will be removed at the office during your follow-up visit. ° °ACTIVITIES °11. You may resume regular (light) daily activities beginning the next day--such as daily self-care, walking, climbing stairs--gradually increasing activities as tolerated.  You may have sexual intercourse when it is comfortable.  Refrain from any heavy lifting or straining until approved by your doctor. °a. You may drive when you are no longer taking prescription pain medication, you can comfortably wear a seatbelt, and you can safely maneuver your car and apply brakes. ° °FOLLOW-UP °12. You should see your doctor in the office for a follow-up appointment approximately 2-3 weeks after your surgery.  You should have been given your post-op/follow-up appointment when   your surgery was scheduled.  If you did not receive a post-op/follow-up appointment, make sure that you call for this appointment within a day or two after you arrive home to insure a convenient appointment time. ° °OTHER INSTRUCTIONS ° °WHEN TO CALL YOUR DOCTOR: °1. Fever over 101.0 °2. Inability to  urinate °3. Continued bleeding from incision. °4. Increased pain, redness, or drainage from the incision. °5. Increasing abdominal pain ° °The clinic staff is available to answer your questions during regular business hours.  Please don’t hesitate to call and ask to speak to one of the nurses for clinical concerns.  If you have a medical emergency, go to the nearest emergency room or call 911.  A surgeon from [facility] is always on call at the hospital. °[address], [address], [place], [place]  [address] ? [address] [address], [place], [place]   [address] °[phone] ? [phone] ? FAX [phone] ° ° ° °

## 2021-02-20 NOTE — ED Notes (Signed)
Informed consent sign and witnessed by this RN at bedside.

## 2021-02-20 NOTE — Anesthesia Postprocedure Evaluation (Signed)
Anesthesia Post Note  Patient: [name]  Procedure(s) Performed: APPENDECTOMY LAPAROSCOPIC (N/A Abdomen)     Patient location during evaluation: PACU Anesthesia Type: General Level of consciousness: awake and alert Pain management: pain level controlled Vital Signs Assessment: post-procedure vital signs reviewed and stable Respiratory status: spontaneous breathing, nonlabored ventilation, respiratory function stable and patient connected to nasal cannula oxygen Cardiovascular status: blood pressure returned to baseline and stable Postop Assessment: no apparent nausea or vomiting Anesthetic complications: no   No complications documented.  Last Vitals:  Vitals:   02/20/21 1214 02/20/21 1427  BP: (!) 156/87 (!) 125/92  Pulse: (!) 102 (!) 109  Resp: 16   Temp: 37.1 C 36.7 C  SpO2: 100% 99%    Last Pain:  Vitals:   02/20/21 1427  TempSrc: Oral  PainSc:                  Dashauna Heymann COKER

## 2021-02-20 NOTE — ED Notes (Signed)
Called lab to add on urine pregnancy

## 2021-02-20 NOTE — ED Provider Notes (Signed)
[facility] EMERGENCY DEPARTMENT Provider Note   CSN: [id number] Arrival date & time: 02/19/21  2012     History Chief Complaint  Patient presents with  . Abdominal Pain    [name] is a 45 y.o. female.  Patient with history of HTN, gluten allergy, presents with symptoms of RLQ abdominal pain, increased belching, nausea and vomiting that started today around 2:00 pm. She thought she might have ingested some gluten which can cause increased abdominal gas but never vomiting or localized pain. She had diarrhea earlier in the day but none since, and reports she is not passing flatus. No previous abdominal surgeries. No fever. No urinary symptoms, vaginal discharge or pelvic pain. No cough, SoB.   The history is provided by the patient. No language interpreter was used.  Abdominal Pain Associated symptoms: diarrhea, nausea and vomiting   Associated symptoms: no chest pain, no chills, no dysuria, no fever, no shortness of breath and no vaginal discharge        Past Medical History:  Diagnosis Date  . ALLERGIC RHINITIS   . HYPERTENSION   . OVARIAN CYST   . Unspecified Anemia     Patient Active Problem List   Diagnosis Date Noted  . Prediabetes 01/15/2020  . History of iron deficiency 01/14/2020  . Family history of diabetes mellitus (DM) 01/05/2020  . Insomnia 12/03/2019  . PMDD (premenstrual dysphoric disorder) 05/23/2019  . Murmur, cardiac 11/30/2018  . Non-celiac gluten sensitivity 11/09/2017  . Urinary frequency 02/04/2015  . Panic anxiety syndrome 09/25/2013  . LIBIDO, DECREASED 09/29/2010  . Allergic rhinitis 03/25/2010  . HYPERTENSION, BENIGN SYSTEMIC 12/28/2006  . OVARIAN CYST 12/28/2006    Past Surgical History:  Procedure Laterality Date  . NO PAST SURGERIES       OB History   No obstetric history on file.     Family History  Problem Relation Age of Onset  . Hypertension Mother   . Hyperlipidemia Mother   . Diabetes Mother    . Cancer Father        Prostate  . Hypertension Sister   . Diabetes Cousin     Social History   Tobacco Use  . Smoking status: Never Smoker  . Smokeless tobacco: Never Used  . Tobacco comment: Married, lives with spouse and 3 children ; Christiane Ha and [place].   Substance Use Topics  . Alcohol use: No  . Drug use: No    Home Medications Prior to Admission medications   Medication Sig Start Date End Date Taking? Authorizing Provider  ALPRAZolam Prudy Feeler) 0.5 MG tablet Take 0.5-1 tablets (0.25-0.5 mg total) by mouth daily as needed for anxiety. 07/30/20   Pincus Sanes, MD  ASHWAGANDHA PO Take by mouth.    [provider]  buPROPion (WELLBUTRIN XL) 150 MG 24 hr tablet Take 1 tablet by mouth every morning. 02/08/21   [provider]  Cholecalciferol (VITAMIN D) 50 MCG (2000 UT) tablet 1 tablet 01/28/21   [provider]  Cyanocobalamin (B-12) 1000 MCG CAPS Takes one daily 09/30/16   Burns, Bobette Mo, MD  escitalopram (LEXAPRO) 10 MG tablet Take 1.5 tablets (15 mg total) by mouth daily. 02/10/21   Pincus Sanes, MD  eszopiclone (LUNESTA) 2 MG TABS tablet Take 1 tablet (2 mg total) by mouth at bedtime as needed for sleep. Take immediately before bedtime 02/10/21   Pincus Sanes, MD  Ferrous Sulfate (IRON PO) Take by mouth.    [provider]  hydrochlorothiazide (  HYDRODIURIL) 25 MG tablet TAKE 1 TABLET BY MOUTH EVERY DAY 12/18/20   Pincus Sanes, MD  Magnesium 300 MG CAPS Take by mouth.    [provider]  RESTASIS 0.05 % ophthalmic emulsion  11/01/20   [provider]  valsartan (DIOVAN) 80 MG tablet TAKE 1 TABLET BY MOUTH EVERY DAY 01/11/21   Pincus Sanes, MD    Allergies    Buspirone, Penicillins, Doxycycline, Gluten meal, and Lactase  Review of Systems   Review of Systems  Constitutional: Negative for chills and fever.  HENT: Negative.   Respiratory: Negative.  Negative for shortness of breath.   Cardiovascular: Negative.   Negative for chest pain.  Gastrointestinal: Positive for abdominal distention, abdominal pain, diarrhea, nausea and vomiting. Negative for blood in stool.  Genitourinary: Negative.  Negative for dysuria, pelvic pain and vaginal discharge.  Musculoskeletal: Negative.  Negative for back pain and myalgias.  Skin: Negative.   Neurological: Negative.  Negative for weakness and light-headedness.    Physical Exam Updated Vital Signs BP (!) 142/84   Pulse (!) 116   Temp 99.3 F (37.4 C) (Oral)   Resp 16   Ht 5\' 3"  (1.6 m)   Wt 78.5 kg   LMP 02/07/2021   SpO2 100%   BMI 30.65 kg/m   Physical Exam Vitals and nursing note reviewed.  Constitutional:      Appearance: She is well-developed.  HENT:     Head: Normocephalic.  Cardiovascular:     Rate and Rhythm: Normal rate and regular rhythm.  Pulmonary:     Effort: Pulmonary effort is normal.     Breath sounds: Normal breath sounds.  Abdominal:     General: Bowel sounds are decreased. There is no distension.     Palpations: Abdomen is soft.     Tenderness: There is abdominal tenderness in the right lower quadrant. There is no guarding or rebound.  Musculoskeletal:        General: Normal range of motion.     Cervical back: Normal range of motion and neck supple.  Skin:    General: Skin is warm and dry.     Findings: No rash.  Neurological:     Mental Status: She is alert.     Cranial Nerves: No cranial nerve deficit.     ED Results / Procedures / Treatments   Labs (all labs ordered are listed, but only abnormal results are displayed) Labs Reviewed  CBC WITH DIFFERENTIAL/PLATELET - Abnormal; Notable for the following components:      Result Value   WBC 15.9 (*)    Neutro Abs 14.3 (*)    All other components within normal limits  COMPREHENSIVE METABOLIC PANEL - Abnormal; Notable for the following components:   Sodium 134 (*)    Glucose, Bld 128 (*)    All other components within normal limits  URINALYSIS, ROUTINE W REFLEX  MICROSCOPIC - Abnormal; Notable for the following components:   pH 9.0 (*)    Ketones, ur 80 (*)    Protein, ur 30 (*)    Bacteria, UA RARE (*)    All other components within normal limits  I-STAT BETA HCG BLOOD, ED ([facility], [facility], [facility] ONLY) - Abnormal; Notable for the following components:   I-stat hCG, quantitative 8.7 (*)    All other components within normal limits  LIPASE, BLOOD  PREGNANCY, URINE    EKG None  Radiology No results found.  Procedures Procedures   Medications Ordered in ED Medications  HYDROmorphone (DILAUDID) injection 0.5 mg (has no administration in time range)  metoCLOPramide (REGLAN) injection 10 mg (has no administration in time range)  diphenhydrAMINE (BENADRYL) injection 12.5 mg (has no administration in time range)    ED Course  I have reviewed the triage vital signs and the nursing notes.  Pertinent labs & imaging results that were available during my care of the patient were reviewed by me and considered in my medical decision making (see chart for details).    MDM Rules/Calculators/A&P                          Patient to ED with abdominal pain, bloating and vomiting starting yesterday. No fever.   Nontoxic in appearance. VSS, tachycardic. There is tenderness focal to the RLQ without peritoneal signs. She has a mild leukocytosis. Feel CT is indicated to evaluate for acute appy.  On re-examination, pain is managed with single dose medication. No nausea.   4:30 - CT is positive for uncomplicated acute appendicitis. Patient and family updated. Surgery paged.  Discussed with Dr. Cliffton Asters, gen surg, who will see the patient in the ED. Rocephin and Flagyl ordered.   Final Clinical Impression(s) / ED Diagnoses Final diagnoses:  None   1. Acute appendicitis  Rx / DC Orders ED Discharge Orders    None       Danne Harbor 02/20/21 0448    Palumbo, April, MD 02/20/21 [phone]

## 2021-02-20 NOTE — Progress Notes (Signed)
Patient arrived to 6N from PACU after laparoscopic appendectomy. Gas/bloating relieved with prn simethicone upon admission; pain relieved with prn oxy 5mg . Patient ate lunch tray; was due to void and voided twice on my shift. D5 1/2 NS started at 50 mL/hr per order. Denies nausea. Ambulatory to bathroom. Husband at bedside.

## 2021-02-20 NOTE — Anesthesia Preprocedure Evaluation (Signed)
Anesthesia Evaluation  Patient identified by MRN, date of birth, ID band Patient awake    Reviewed: Allergy & Precautions, NPO status , Patient's Chart, lab work & pertinent test results  Airway Mallampati: II  TM Distance: >3 FB Neck ROM: Full    Dental  (+) Teeth Intact, Dental Advisory Given   Pulmonary    breath sounds clear to auscultation       Cardiovascular hypertension,  Rhythm:Regular Rate:Normal     Neuro/Psych    GI/Hepatic   Endo/Other    Renal/GU      Musculoskeletal   Abdominal   Peds  Hematology   Anesthesia Other Findings   Reproductive/Obstetrics                             Anesthesia Physical Anesthesia Plan  ASA: III and emergent  Anesthesia Plan: General   Post-op Pain Management:    Induction: Intravenous, Cricoid pressure planned and Rapid sequence  PONV Risk Score and Plan: Ondansetron and Dexamethasone  Airway Management Planned: Oral ETT  Additional Equipment:   Intra-op Plan:   Post-operative Plan: Extubation in OR  Informed Consent: I have reviewed the patients History and Physical, chart, labs and discussed the procedure including the risks, benefits and alternatives for the proposed anesthesia with the patient or authorized representative who has indicated his/her understanding and acceptance.     Dental advisory given  Plan Discussed with: CRNA and Anesthesiologist  Anesthesia Plan Comments:         Anesthesia Quick Evaluation

## 2021-02-21 ENCOUNTER — Encounter (HOSPITAL_COMMUNITY): Payer: Self-pay | Admitting: General Surgery

## 2021-02-21 LAB — CBC
HCT: 34 % — ABNORMAL LOW (ref 36.0–46.0)
Hemoglobin: 11.4 g/dL — ABNORMAL LOW (ref 12.0–15.0)
MCH: 27.7 pg (ref 26.0–34.0)
MCHC: 33.5 g/dL (ref 30.0–36.0)
MCV: 82.7 fL (ref 80.0–100.0)
Platelets: 273 10*3/uL (ref 150–400)
RBC: 4.11 MIL/uL (ref 3.87–5.11)
RDW: 14 % (ref 11.5–15.5)
WBC: 18.5 10*3/uL — ABNORMAL HIGH (ref 4.0–10.5)
nRBC: 0 % (ref 0.0–0.2)

## 2021-02-21 MED ORDER — OXYCODONE HCL 5 MG PO TABS: 5.0000 mg | ORAL_TABLET | Freq: Four times a day (QID) | ORAL | 0 refills | Status: DC | PRN

## 2021-02-21 MED ORDER — ACETAMINOPHEN 500 MG PO TABS: 1000.0000 mg | ORAL_TABLET | Freq: Four times a day (QID) | ORAL | 0 refills | Status: DC | PRN

## 2021-02-21 NOTE — Discharge Summary (Signed)
[facility] Discharge Summary   Patient ID: [name] MRN: [id number] DOB/AGE: 43-Jul-1977 43 y.o.  Admit date: 02/19/2021 Discharge date: 02/21/2021  Admitting Diagnosis: Acute appendicitis   Discharge Diagnosis Patient Active Problem List   Diagnosis Date Noted  . Acute appendicitis 02/20/2021  . Prediabetes 01/15/2020  . History of iron deficiency 01/14/2020  . Family history of diabetes mellitus (DM) 01/05/2020  . Insomnia 12/03/2019  . PMDD (premenstrual dysphoric disorder) 05/23/2019  . Murmur, cardiac 11/30/2018  . Non-celiac gluten sensitivity 11/09/2017  . Urinary frequency 02/04/2015  . Panic anxiety syndrome 09/25/2013  . LIBIDO, DECREASED 09/29/2010  . Allergic rhinitis 03/25/2010  . HYPERTENSION, BENIGN SYSTEMIC 12/28/2006  . OVARIAN CYST 12/28/2006    Consultants None  Imaging: CT ABDOMEN PELVIS W CONTRAST  Result Date: 02/20/2021 CLINICAL DATA:  Right lower quadrant pain with appendicitis suspected. EXAM: CT ABDOMEN AND PELVIS WITH CONTRAST TECHNIQUE: Multidetector CT imaging of the abdomen and pelvis was performed using the standard protocol following bolus administration of intravenous contrast. CONTRAST:  OMNIPAQUE IOHEXOL 300 MG/ML  SOLN COMPARISON:  None. FINDINGS: Lower chest:  No contributory findings. Hepatobiliary: No focal liver abnormality.No evidence of biliary obstruction or stone. Pancreas: Unremarkable. Spleen: Unremarkable. Adrenals/Urinary Tract: 14 mm right adrenal nodule with homogeneous appearance. No chart history of malignancy. Tiny bilateral renal cystic densities. No hydronephrosis or stone. Unremarkable bladder. Stomach/Bowel: Thick wall appendix with mesoappendiceal stranding. Maximal appendiceal thickness is 19 mm. An appendicolith is present at the base. The cecum shows edema. No perforation or abscess noted. Vascular/Lymphatic: No acute vascular abnormality. No mass or adenopathy. Reproductive:No pathologic  findings.  Corpus luteum on the left. Other: Trace reactive appearing pelvic fluid. Musculoskeletal: No acute abnormalities. IMPRESSION: 1. Acute appendicitis without perforation. An appendicolith is present. 2. 14 mm right adrenal nodule, statistically an adenoma. Electronically Signed   By: Marnee Spring M.D.   On: 02/20/2021 04:18    Procedures Dr. Jordan Likes and Kinsinger (02/20/2021) - Laparoscopic Appendectomy  Hospital Course:  [name] is a 45yo female who presented to [facility]/23 with acute onset abdominal pain.  Workup showed acute appendicitis.  Patient was admitted and underwent procedure listed above.  Tolerated procedure well and was transferred to the floor.  Diet was advanced as tolerated.  On POD1 the patient was voiding well, tolerating diet, ambulating well, pain well controlled, vital signs stable, incisions c/d/i and felt stable for discharge home.  Patient will follow up as below and knows to call with questions or concerns.    I have personally reviewed the patients medication history on the [place] controlled substance database.    Physical Exam: General:  Alert, NAD, pleasant, comfortable Pulm: rate and effort normal Abd:  Soft, ND, appropriately tender, multiple lap incisions C/D/I  Allergies as of 02/21/2021      Reactions   Buspirone Other (See Comments)   Gave her panic attacks   Penicillins Hives, Itching   Doxycycline Itching, Rash   Gluten Meal Anxiety, Nausea Only, Other (See Comments), Swelling   Lactase Itching, Rash      Medication List    TAKE these medications   acetaminophen 500 MG tablet Commonly known as: TYLENOL Take 2 tablets (1,000 mg total) by mouth every 6 (six) hours as needed for mild pain.   ALPRAZolam 0.5 MG tablet Commonly known as: Xanax Take 0.5-1 tablets (0.25-0.5 mg total) by mouth daily as needed for anxiety.   ASHWAGANDHA PO Take 1 tablet by mouth daily as needed (blood pressure).  B-12 1000 MCG Caps Takes one  daily What changed:   how much to take  how to take this  when to take this  additional instructions   buPROPion 150 MG 24 hr tablet Commonly known as: WELLBUTRIN XL Take 1 tablet by mouth every morning.   escitalopram 10 MG tablet Commonly known as: LEXAPRO Take 1.5 tablets (15 mg total) by mouth daily.   eszopiclone 2 MG Tabs tablet Commonly known as: LUNESTA Take 1 tablet (2 mg total) by mouth at bedtime as needed for sleep. Take immediately before bedtime   hydrochlorothiazide 25 MG tablet Commonly known as: HYDRODIURIL TAKE 1 TABLET BY MOUTH EVERY DAY   IRON PO Take 1 tablet by mouth every other day.   Magnesium 300 MG Caps Take by mouth.   oxyCODONE 5 MG immediate release tablet Commonly known as: Oxy IR/ROXICODONE Take 1 tablet (5 mg total) by mouth every 6 (six) hours as needed for severe pain.   Restasis 0.05 % ophthalmic emulsion Generic drug: cycloSPORINE Place 1 drop into both eyes as needed (dry eyes).   valsartan 80 MG tablet Commonly known as: DIOVAN TAKE 1 TABLET BY MOUTH EVERY DAY   Vitamin D 50 MCG (2000 UT) tablet Take 2,000 Units by mouth every other day.         Follow-up Information    [facility], [place]. Call in 3 week(s).   Specialty: General Surgery Contact information: [address] [place] [address] [phone]              Signed: Franne Forts, [facility] 02/21/2021, 9:50 AM Please see Amion for pager number during day hours 7:00am-4:30pm

## 2021-02-21 NOTE — Progress Notes (Signed)
Derrel Nip to be D/C'd  per MD order. Discussed with the patient and all questions fully answered.  VSS, Skin clean, dry and intact without evidence of skin break down, no evidence of skin tears noted.  IV catheter discontinued intact. Site without signs and symptoms of complications. Dressing and pressure applied.  An After Visit Summary was printed and given to the patient. Patient received prescription.  D/c education completed with patient/family including follow up instructions, medication list, d/c activities limitations if indicated, with other d/c instructions as indicated by MD - patient able to verbalize understanding, all questions fully answered.   Patient instructed to return to ED, call 911, or call MD for any changes in condition.   Patient to be escorted via WC, and D/C home via private auto.

## 2021-02-23 LAB — SURGICAL PATHOLOGY

## 2021-03-26 ENCOUNTER — Other Ambulatory Visit: Payer: Self-pay | Admitting: Internal Medicine

## 2021-06-17 ENCOUNTER — Other Ambulatory Visit: Payer: Self-pay | Admitting: Internal Medicine

## 2021-07-01 ENCOUNTER — Other Ambulatory Visit: Payer: Self-pay | Admitting: Internal Medicine

## 2021-08-11 ENCOUNTER — Encounter: Payer: Self-pay | Admitting: Internal Medicine

## 2021-08-11 NOTE — Patient Instructions (Addendum)
  Blood work was ordered.      Medications changes include :   none     Please followup in 6 months for a physical  

## 2021-08-11 NOTE — Progress Notes (Signed)
Subjective:    Patient ID: [name], female    DOB: 1976-05-09, 45 y.o.   MRN: [id number]  This visit occurred during the SARS-CoV-2 public health emergency.  Safety protocols were in place, including screening questions prior to the visit, additional usage of staff PPE, and extensive cleaning of exam room while observing appropriate contact time as indicated for disinfecting solutions.     HPI The patient is here for follow up of their chronic medical problems, including htn, prediabetes, anxiety, PMDD, insomnia  She is exercising regularly.   She has decreased her carbs and sugars and has seen some weight loss.  She wonders if she needs to see nutritionist to help further with weight loss since she has struggled with this.  Medications and allergies reviewed with patient and updated if appropriate.  Patient Active Problem List   Diagnosis Date Noted   Prediabetes 01/15/2020   History of iron deficiency 01/14/2020   Family history of diabetes mellitus (DM) 01/05/2020   Insomnia 12/03/2019   PMDD (premenstrual dysphoric disorder) 05/23/2019   Murmur, cardiac 11/30/2018   Non-celiac gluten sensitivity 11/09/2017   Urinary frequency 02/04/2015   Panic anxiety syndrome 09/25/2013   LIBIDO, DECREASED 09/29/2010   Allergic rhinitis 03/25/2010   HYPERTENSION, BENIGN SYSTEMIC 12/28/2006   OVARIAN CYST 12/28/2006    Current Outpatient Medications on File Prior to Visit  Medication Sig Dispense Refill   acetaminophen (TYLENOL) 500 MG tablet Take 2 tablets (1,000 mg total) by mouth every 6 (six) hours as needed for mild pain. 30 tablet 0   ASHWAGANDHA PO Take 1 tablet by mouth daily as needed (blood pressure).     Cholecalciferol (VITAMIN D) 50 MCG (2000 UT) tablet Take 2,000 Units by mouth every other day.     Cyanocobalamin (B-12) 1000 MCG CAPS Takes one daily (Patient taking differently: Take 1 tablet by mouth daily.)     escitalopram (LEXAPRO) 10 MG tablet Take 1.5  tablets (15 mg total) by mouth daily. 135 tablet 1   eszopiclone (LUNESTA) 2 MG TABS tablet TAKE 1 TABLET (2 MG TOTAL) BY MOUTH AT BEDTIME AS NEEDED FOR SLEEP. TAKE IMMEDIATELY BEFORE BEDTIME 30 tablet 5   Ferrous Sulfate (IRON PO) Take 1 tablet by mouth every other day.     hydrochlorothiazide (HYDRODIURIL) 25 MG tablet TAKE 1 TABLET BY MOUTH EVERY DAY 90 tablet 1   Magnesium 300 MG CAPS Take by mouth.     RESTASIS 0.05 % ophthalmic emulsion Place 1 drop into both eyes as needed (dry eyes).     valsartan (DIOVAN) 80 MG tablet TAKE 1 TABLET BY MOUTH EVERY DAY 90 tablet 1   No current facility-administered medications on file prior to visit.    Past Medical History:  Diagnosis Date   ALLERGIC RHINITIS    HYPERTENSION    OVARIAN CYST    Unspecified Anemia     Past Surgical History:  Procedure Laterality Date   LAPAROSCOPIC APPENDECTOMY N/A 02/20/2021   Procedure: APPENDECTOMY LAPAROSCOPIC;  Surgeon: Kinsinger, De Blanch, MD;  Location: [facility] OR;  Service: General;  Laterality: N/A;   NO PAST SURGERIES      Social History   Socioeconomic History   Marital status: Married    Spouse name: Not on file   Number of children: Not on file   Years of education: Not on file   Highest education level: Not on file  Occupational History   Not on file  Tobacco Use   Smoking status:  Never   Smokeless tobacco: Never   Tobacco comments:    Married, lives with spouse and 3 children ; Christiane Ha and [place].   Substance and Sexual Activity   Alcohol use: No   Drug use: No   Sexual activity: Not on file  Other Topics Concern   Not on file  Social History Narrative   Married, 3 kids   Exercise: personal trainer 3/week, exercises on own 2/week   Not working   Social Determinants of Corporate investment banker Strain: Not on file  Food Insecurity: Not on file  Transportation Needs: Not on file  Physical Activity: Not on file  Stress: Not on file  Social Connections: Not on file     Family History  Problem Relation Age of Onset   Hypertension Mother    Hyperlipidemia Mother    Diabetes Mother    Cancer Father        Prostate   Hypertension Sister    Diabetes Cousin     Review of Systems  Constitutional:  Negative for fever.  Respiratory:  Negative for cough, shortness of breath and wheezing.   Cardiovascular:  Positive for palpitations (with anxiety). Negative for chest pain and leg swelling.  Neurological:  Negative for light-headedness and headaches.      Objective:   Vitals:   08/12/21 1128  BP: 120/84  Pulse: 80  Temp: 98.5 F (36.9 C)  SpO2: 95%   BP Readings from Last 3 Encounters:  08/12/21 120/84  02/21/21 130/87  02/10/21 130/88   Wt Readings from Last 3 Encounters:  08/12/21 177 lb (80.3 kg)  02/19/21 173 lb (78.5 kg)  02/10/21 173 lb (78.5 kg)   Body mass index is 31.35 kg/m.   Physical Exam    Constitutional: Appears well-developed and well-nourished. No distress.  HENT:  Head: Normocephalic and atraumatic.  Neck: Neck supple. No tracheal deviation present. No thyromegaly present.  No cervical lymphadenopathy Cardiovascular: Normal rate, regular rhythm and normal heart sounds.   No murmur heard. No carotid bruit .  No edema Pulmonary/Chest: Effort normal and breath sounds normal. No respiratory distress. No has no wheezes. No rales.  Skin: Skin is warm and dry. Not diaphoretic.  Psychiatric: Normal mood and affect. Behavior is normal.      Assessment & Plan:    See Problem List for Assessment and Plan of chronic medical problems.

## 2021-08-12 ENCOUNTER — Other Ambulatory Visit: Payer: Self-pay

## 2021-08-12 ENCOUNTER — Ambulatory Visit (INDEPENDENT_AMBULATORY_CARE_PROVIDER_SITE_OTHER): Payer: 59 | Admitting: Internal Medicine

## 2021-08-12 VITALS — BP 120/84 | HR 80 | Temp 98.5°F | Ht 63.0 in | Wt 177.0 lb

## 2021-08-12 DIAGNOSIS — G4709 Other insomnia: Secondary | ICD-10-CM

## 2021-08-12 DIAGNOSIS — F3281 Premenstrual dysphoric disorder: Secondary | ICD-10-CM | POA: Diagnosis not present

## 2021-08-12 DIAGNOSIS — F410 Panic disorder [episodic paroxysmal anxiety] without agoraphobia: Secondary | ICD-10-CM

## 2021-08-12 DIAGNOSIS — I10 Essential (primary) hypertension: Secondary | ICD-10-CM

## 2021-08-12 DIAGNOSIS — R7303 Prediabetes: Secondary | ICD-10-CM | POA: Diagnosis not present

## 2021-08-12 LAB — CBC WITH DIFFERENTIAL/PLATELET
Basophils Absolute: 0.1 10*3/uL (ref 0.0–0.1)
Basophils Relative: 0.7 % (ref 0.0–3.0)
Eosinophils Absolute: 0.1 10*3/uL (ref 0.0–0.7)
Eosinophils Relative: 1.3 % (ref 0.0–5.0)
HCT: 37.3 % (ref 36.0–46.0)
Hemoglobin: 12 g/dL (ref 12.0–15.0)
Lymphocytes Relative: 28.9 % (ref 12.0–46.0)
Lymphs Abs: 2.6 10*3/uL (ref 0.7–4.0)
MCHC: 32.2 g/dL (ref 30.0–36.0)
MCV: 83.5 fl (ref 78.0–100.0)
Monocytes Absolute: 0.6 10*3/uL (ref 0.1–1.0)
Monocytes Relative: 6.9 % (ref 3.0–12.0)
Neutro Abs: 5.6 10*3/uL (ref 1.4–7.7)
Neutrophils Relative %: 62.2 % (ref 43.0–77.0)
Platelets: 266 10*3/uL (ref 150.0–400.0)
RBC: 4.47 Mil/uL (ref 3.87–5.11)
RDW: 14.9 % (ref 11.5–15.5)
WBC: 9 10*3/uL (ref 4.0–10.5)

## 2021-08-12 LAB — HEMOGLOBIN A1C: Hgb A1c MFr Bld: 5.9 % (ref 4.6–6.5)

## 2021-08-12 LAB — COMPREHENSIVE METABOLIC PANEL
ALT: 22 U/L (ref 0–35)
AST: 20 U/L (ref 0–37)
Albumin: 4.3 g/dL (ref 3.5–5.2)
Alkaline Phosphatase: 65 U/L (ref 39–117)
BUN: 14 mg/dL (ref 6–23)
CO2: 29 mEq/L (ref 19–32)
Calcium: 9.3 mg/dL (ref 8.4–10.5)
Chloride: 102 mEq/L (ref 96–112)
Creatinine, Ser: 0.77 mg/dL (ref 0.40–1.20)
GFR: 94.87 mL/min (ref >60.00)
Glucose, Bld: 97 mg/dL (ref 70–99)
Potassium: 3.9 mEq/L (ref 3.5–5.1)
Sodium: 138 mEq/L (ref 135–145)
Total Bilirubin: 0.3 mg/dL (ref 0.2–1.2)
Total Protein: 7.4 g/dL (ref 6.0–8.3)

## 2021-08-12 MED ORDER — BUPROPION HCL ER (XL) 150 MG PO TB24: 150.0000 mg | ORAL_TABLET | Freq: Every morning | ORAL | Status: DC

## 2021-08-12 MED ORDER — ALPRAZOLAM 0.5 MG PO TABS: 0.2500 mg | ORAL_TABLET | Freq: Every day | ORAL | 0 refills | Status: DC | PRN

## 2021-08-12 NOTE — Assessment & Plan Note (Signed)
Chronic Controlled, Stable Continue Lunesta 2 mg nightly as needed 

## 2021-08-12 NOTE — Assessment & Plan Note (Signed)
Chronic Check a1c Low sugar / carb diet Stressed regular exercise  

## 2021-08-12 NOTE — Assessment & Plan Note (Addendum)
Chronic Following with gynecology Currently controlled Continue Wellbutrin XL 150 mg daily and Lexapro 15 mg daily Continue alprazolam 0.25-0.5 mg daily as needed Continue regular exercise, healthy diet-adjusting diet to help with weight loss and to see if anything helps around time of ovulation when she does not feel well

## 2021-08-12 NOTE — Assessment & Plan Note (Addendum)
Chronic Controlled, Stable-she still struggles around the time of ovulation, but is trying different things to see what helps most at that time Continue Lexapro 15 mg daily, Wellbutrin XL 150 mg daily, alprazolam 0.25-0.5 mg daily as needed

## 2021-08-12 NOTE — Assessment & Plan Note (Signed)
Chronic Blood pressure well controlled CMP Continue valsartan 80 mg daily, hydrochlorothiazide 25 mg daily

## 2021-10-04 ENCOUNTER — Other Ambulatory Visit: Payer: Self-pay | Admitting: Internal Medicine

## 2021-12-09 ENCOUNTER — Other Ambulatory Visit: Payer: Self-pay | Admitting: Internal Medicine

## 2021-12-15 ENCOUNTER — Encounter: Payer: Self-pay | Admitting: Internal Medicine

## 2021-12-30 ENCOUNTER — Other Ambulatory Visit: Payer: Self-pay | Admitting: Internal Medicine

## 2022-01-20 ENCOUNTER — Other Ambulatory Visit: Payer: Self-pay | Admitting: Internal Medicine

## 2022-02-10 ENCOUNTER — Ambulatory Visit: Payer: 59 | Admitting: Internal Medicine

## 2022-02-11 ENCOUNTER — Ambulatory Visit: Payer: 59 | Admitting: Internal Medicine

## 2022-02-21 ENCOUNTER — Encounter: Payer: Self-pay | Admitting: Internal Medicine

## 2022-02-21 NOTE — Patient Instructions (Addendum)
? ? ? ?  Blood work was ordered.   ? ? ?Medications changes include :   none ? ? ?Your prescription(s) have been sent to your pharmacy.  ? ? ? ?Return in about 6 months (around 08/24/2022) for Physical Exam. ? ?

## 2022-02-21 NOTE — Progress Notes (Signed)
? ? ? ? ?Subjective:  ? ? Patient ID: [name], female    DOB: 03/23/1976, 46 y.o.   MRN: [id number] ? ?This visit occurred during the SARS-CoV-2 public health emergency.  Safety protocols were in place, including screening questions prior to the visit, additional usage of staff PPE, and extensive cleaning of exam room while observing appropriate contact time as indicated for disinfecting solutions.   ? ? ?HPI ?[name] is here for follow up of her chronic medical problems, including htn, prediabetes, anxiety, PMDD, insomnia ? ?Left ear pressure, body aches.  Symptoms started two days ago.  ? Sinus infection.  Doing nasal spray, allergy pills.   ? ? ?She is eating well, sleeping better.   ? ? ? ?Medications and allergies reviewed with patient and updated if appropriate. ? ?Current Outpatient Medications on File Prior to Visit  ?Medication Sig Dispense Refill  ? ALPRAZolam (XANAX) 0.5 MG tablet TAKE 0.5-1 TABLETS (0.25-0.5 MG TOTAL) BY MOUTH DAILY AS NEEDED FOR ANXIETY 30 tablet 0  ? ASHWAGANDHA PO Take 1 tablet by mouth daily as needed (blood pressure).    ? buPROPion (WELLBUTRIN XL) 150 MG 24 hr tablet Take 1 tablet (150 mg total) by mouth every morning.    ? Cholecalciferol (VITAMIN D) 50 MCG (2000 UT) tablet Take 2,000 Units by mouth every other day.    ? Cyanocobalamin (B-12) 1000 MCG CAPS Takes one daily (Patient taking differently: Take 1 tablet by mouth daily.)    ? escitalopram (LEXAPRO) 10 MG tablet TAKE 1 AND 1/2 TABLETS DAILY BY MOUTH 135 tablet 1  ? eszopiclone (LUNESTA) 2 MG TABS tablet TAKE 1 TABLET (2 MG TOTAL) BY MOUTH IMMEDIATELY BEFORE BEDTIME AS NEEDED FOR SLEEP 30 tablet 3  ? Ferrous Sulfate (IRON PO) Take 1 tablet by mouth every other day.    ? hydrochlorothiazide (HYDRODIURIL) 25 MG tablet TAKE 1 TABLET BY MOUTH EVERY DAY 90 tablet 1  ? Magnesium 300 MG CAPS Take by mouth.    ? valsartan (DIOVAN) 80 MG tablet TAKE 1 TABLET BY MOUTH EVERY DAY 90 tablet 1  ? ?No current  facility-administered medications on file prior to visit.  ? ? ? ?Review of Systems  ?Constitutional:  Negative for fever.  ?HENT:  Positive for congestion (mild), ear pain (left ear mild pain), postnasal drip, sinus pressure (left maxillary) and sore throat.   ?Respiratory:  Negative for cough, shortness of breath and wheezing.   ?Cardiovascular:  Negative for chest pain, palpitations and leg swelling.  ?Neurological:  Positive for dizziness (occ). Negative for light-headedness and headaches.  ? ?   ?Objective:  ? ?Vitals:  ? 02/22/22 1404  ?BP: 124/74  ?Pulse: 97  ?Temp: 98.9 ?F (37.2 ?C)  ?SpO2: 98%  ? ?BP Readings from Last 3 Encounters:  ?02/22/22 124/74  ?08/12/21 120/84  ?02/21/21 130/87  ? ?Wt Readings from Last 3 Encounters:  ?02/22/22 178 lb (80.7 kg)  ?08/12/21 177 lb (80.3 kg)  ?02/19/21 173 lb (78.5 kg)  ? ?Body mass index is 31.53 kg/m?. ? ?  ?Physical Exam ?Constitutional:   ?   General: She is not in acute distress. ?   Appearance: Normal appearance.  ?HENT:  ?   Head: Normocephalic and atraumatic.  ?   Right Ear: Tympanic membrane, ear canal and external ear normal.  ?   Left Ear: Tympanic membrane, ear canal and external ear normal.  ?   Mouth/Throat:  ?   Mouth: Mucous membranes are moist.  ?  Pharynx: No oropharyngeal exudate or posterior oropharyngeal erythema.  ?Eyes:  ?   Conjunctiva/sclera: Conjunctivae normal.  ?Cardiovascular:  ?   Rate and Rhythm: Normal rate and regular rhythm.  ?   Heart sounds: Normal heart sounds. No murmur heard. ?Pulmonary:  ?   Effort: Pulmonary effort is normal. No respiratory distress.  ?   Breath sounds: Normal breath sounds. No wheezing.  ?Musculoskeletal:  ?   Cervical back: Neck supple.  ?   Right lower leg: No edema.  ?   Left lower leg: No edema.  ?Lymphadenopathy:  ?   Cervical: No cervical adenopathy.  ?Skin: ?   General: Skin is warm and dry.  ?   Findings: No rash.  ?Neurological:  ?   Mental Status: She is alert. Mental status is at baseline.   ?Psychiatric:     ?   Mood and Affect: Mood normal.     ?   Behavior: Behavior normal.  ? ?   ? ?Lab Results  ?Component Value Date  ? WBC 9.0 08/12/2021  ? HGB 12.0 08/12/2021  ? HCT 37.3 08/12/2021  ? PLT 266.0 08/12/2021  ? GLUCOSE 97 08/12/2021  ? CHOL 186 01/14/2020  ? TRIG 70.0 01/14/2020  ? HDL 57.50 01/14/2020  ? LDLDIRECT 149.5 09/25/2013  ? LDLCALC 115 (H) 01/14/2020  ? ALT 22 08/12/2021  ? AST 20 08/12/2021  ? NA 138 08/12/2021  ? K 3.9 08/12/2021  ? CL 102 08/12/2021  ? CREATININE 0.77 08/12/2021  ? BUN 14 08/12/2021  ? CO2 29 08/12/2021  ? TSH 1.19 01/14/2020  ? HGBA1C 5.9 08/12/2021  ? ? ? ?Assessment & Plan:  ? ? ?See Problem List for Assessment and Plan of chronic medical problems.  ? ? ?

## 2022-02-22 ENCOUNTER — Ambulatory Visit (INDEPENDENT_AMBULATORY_CARE_PROVIDER_SITE_OTHER): Payer: 59 | Admitting: Internal Medicine

## 2022-02-22 VITALS — BP 124/74 | HR 97 | Temp 98.9°F | Ht 63.0 in | Wt 178.0 lb

## 2022-02-22 DIAGNOSIS — I10 Essential (primary) hypertension: Secondary | ICD-10-CM | POA: Diagnosis not present

## 2022-02-22 DIAGNOSIS — J3089 Other allergic rhinitis: Secondary | ICD-10-CM

## 2022-02-22 DIAGNOSIS — G4709 Other insomnia: Secondary | ICD-10-CM

## 2022-02-22 DIAGNOSIS — R7303 Prediabetes: Secondary | ICD-10-CM

## 2022-02-22 DIAGNOSIS — F410 Panic disorder [episodic paroxysmal anxiety] without agoraphobia: Secondary | ICD-10-CM

## 2022-02-22 DIAGNOSIS — F3281 Premenstrual dysphoric disorder: Secondary | ICD-10-CM

## 2022-02-22 LAB — COMPREHENSIVE METABOLIC PANEL
ALT: 30 U/L (ref 0–35)
AST: 21 U/L (ref 0–37)
Albumin: 4.3 g/dL (ref 3.5–5.2)
Alkaline Phosphatase: 82 U/L (ref 39–117)
BUN: 14 mg/dL (ref 6–23)
CO2: 28 mEq/L (ref 19–32)
Calcium: 9.1 mg/dL (ref 8.4–10.5)
Chloride: 102 mEq/L (ref 96–112)
Creatinine, Ser: 0.78 mg/dL (ref 0.40–1.20)
GFR: 93.06 mL/min (ref >60.00)
Glucose, Bld: 89 mg/dL (ref 70–99)
Potassium: 3.8 mEq/L (ref 3.5–5.1)
Sodium: 136 mEq/L (ref 135–145)
Total Bilirubin: 0.4 mg/dL (ref 0.2–1.2)
Total Protein: 7.4 g/dL (ref 6.0–8.3)

## 2022-02-22 LAB — HEMOGLOBIN A1C: Hgb A1c MFr Bld: 5.9 % (ref 4.6–6.5)

## 2022-02-22 NOTE — Assessment & Plan Note (Signed)
Chronic Check a1c Low sugar / carb diet Stressed regular exercise  

## 2022-02-22 NOTE — Assessment & Plan Note (Signed)
Chronic ?Following with Gyn ?controlled ?Continue lexapro 10 mg daily, wellbutrin xl 150 mg daily ?Continue xanax 0.25-0.5 mg daily prn ?Continue regular exercise, healthy diet ?

## 2022-02-22 NOTE — Assessment & Plan Note (Signed)
Chronic ?BP well controlled ?Continue hctz 25 mg daily, valsartan 80 mg daily ?cmp ? ?

## 2022-02-22 NOTE — Assessment & Plan Note (Signed)
Chronic Controlled, stable Continue lunesta 2 mg nightly  

## 2022-02-22 NOTE — Assessment & Plan Note (Signed)
Chronic ?Increase left ear pain, PND, mild congestion, ST, sinus pressure - likely allergies, less likely viral sinus infection ?Continue allergy medication daily,nasal spray, can add in coricidin if needed ?

## 2022-02-22 NOTE — Assessment & Plan Note (Signed)
Chronic ?Controlled, stable ?Continue lexapro 10 mg daily, wellbutrin xl 150 mg daily ? ?

## 2022-03-05 ENCOUNTER — Other Ambulatory Visit: Payer: Self-pay | Admitting: Internal Medicine

## 2022-03-07 ENCOUNTER — Encounter: Payer: Self-pay | Admitting: Internal Medicine

## 2022-06-10 ENCOUNTER — Other Ambulatory Visit: Payer: Self-pay | Admitting: Internal Medicine

## 2022-06-18 ENCOUNTER — Other Ambulatory Visit: Payer: Self-pay | Admitting: Internal Medicine

## 2022-06-27 ENCOUNTER — Other Ambulatory Visit: Payer: Self-pay | Admitting: Internal Medicine

## 2022-07-26 IMAGING — CT CT ABD-PELV W/ CM
2 of 5 series · 16 of 46 positions shown, 18 images · IV contrast (APPLIED)
Comparison: None.

CLINICAL DATA: Right lower quadrant pain with appendicitis
suspected.

EXAM:
CT ABDOMEN AND PELVIS WITH CONTRAST
TECHNIQUE: Multidetector CT imaging of the abdomen and pelvis was performed
using the standard protocol following bolus administration of
intravenous contrast.
CONTRAST:  100mL OMNIPAQUE IOHEXOL 300 MG/ML  SOLN

[Series 3: abdomen 5.0 · axial · 0.69mm/px · z∈[-440,-70]mm · 13 of 88 slices shown, 15 images]
[im 7/88  soft-tissue]
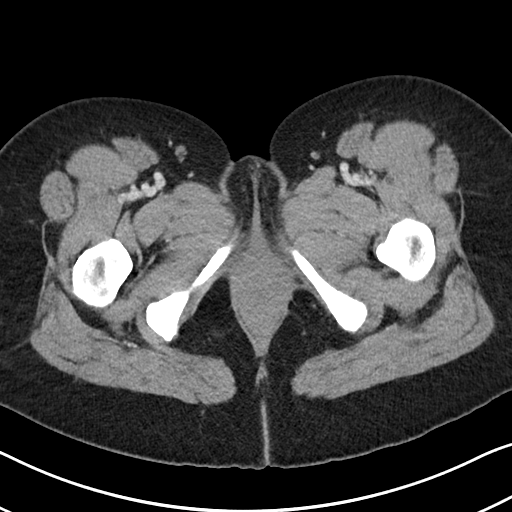
[im 7/88  bone]
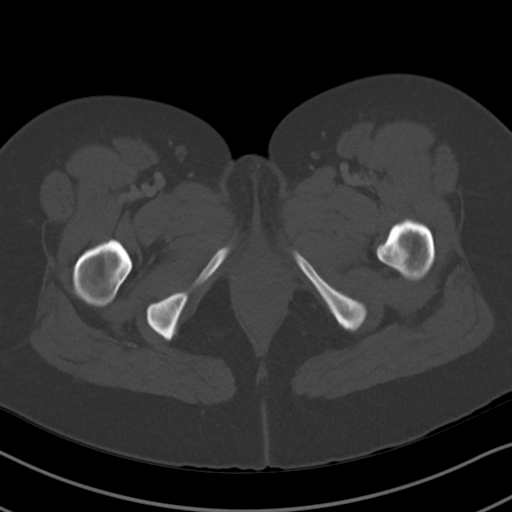
[im 13/88  soft-tissue]
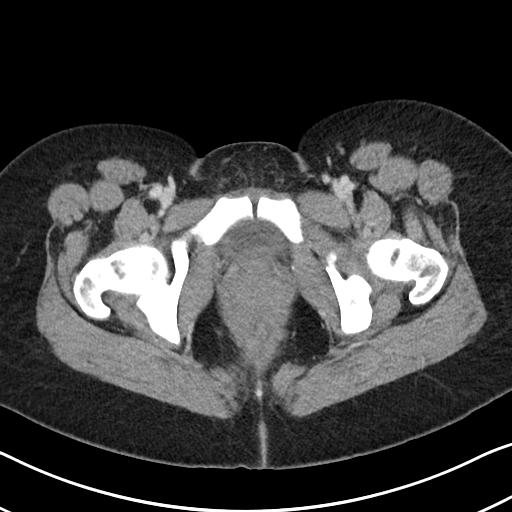
[im 19/88  soft-tissue]
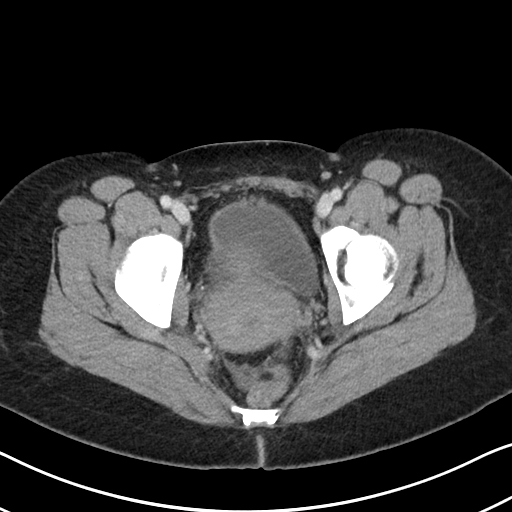
[im 25/88  soft-tissue]
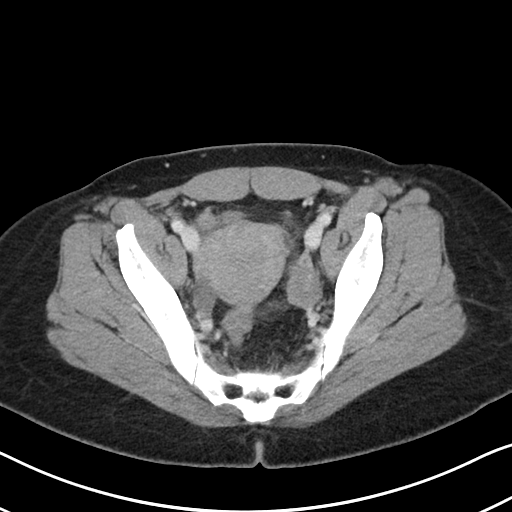
[im 32/88  soft-tissue]
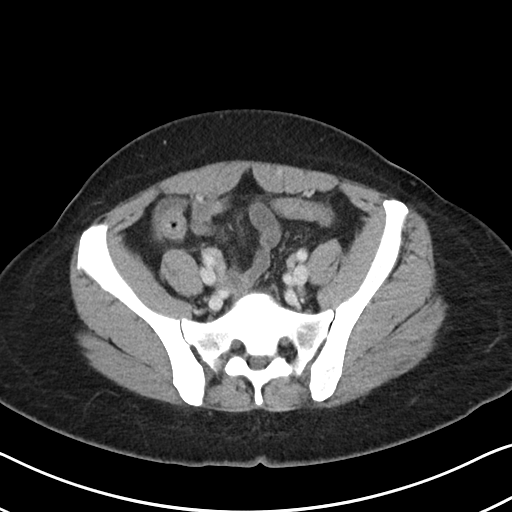
[im 38/88  soft-tissue]
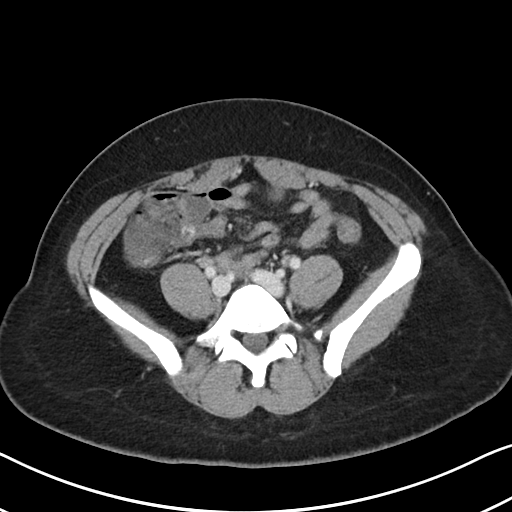
[im 44/88  soft-tissue]
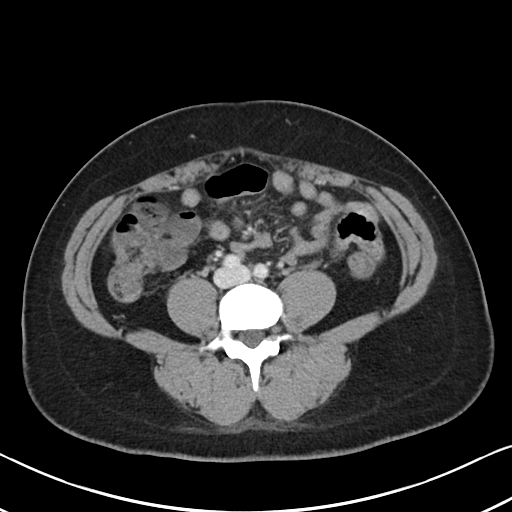
[im 50/88  soft-tissue]
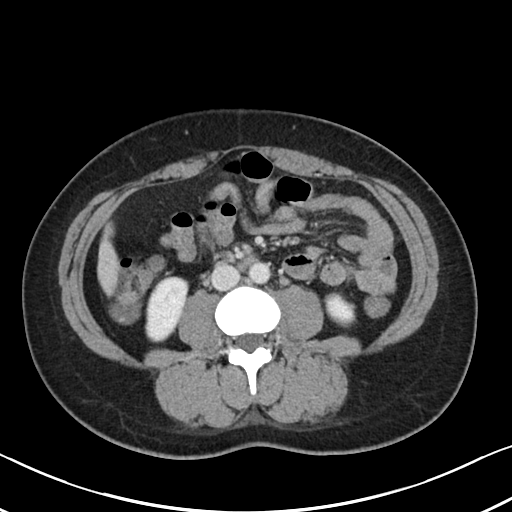
[im 56/88  soft-tissue]
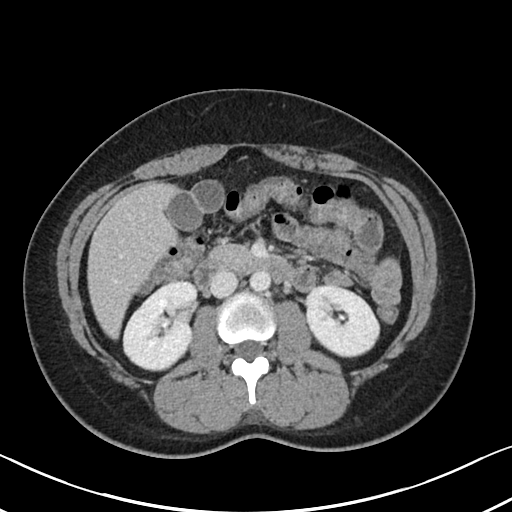
[im 56/88  bone]
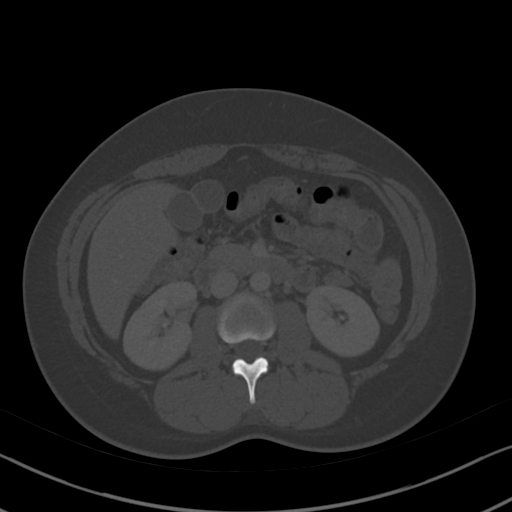
[im 63/88  soft-tissue]
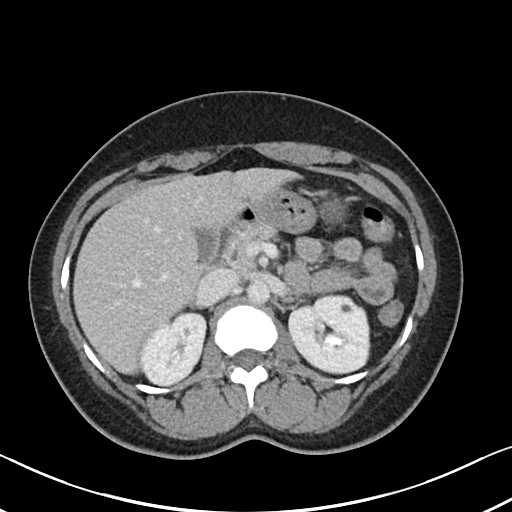
[im 69/88  soft-tissue]
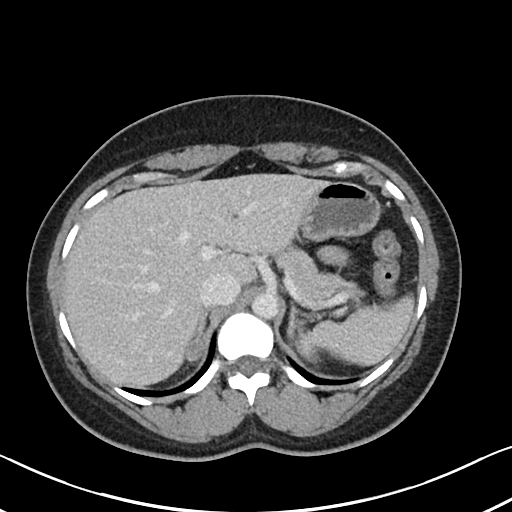
[im 75/88  soft-tissue]
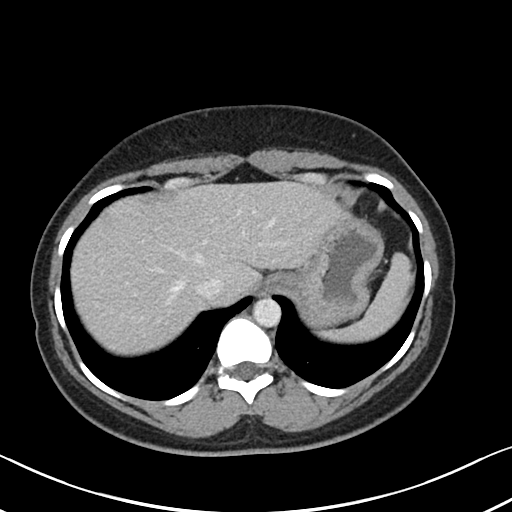
[im 81/88  soft-tissue]
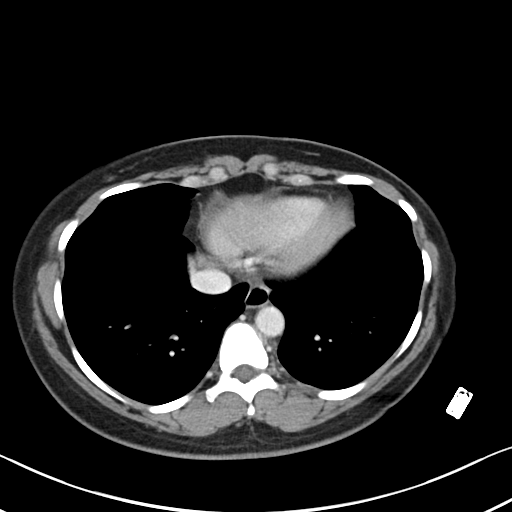

[Series 6: abdomen 3.0 mpr cor · coronal · 0.85mm/px · 3 of 96 slices shown]
[im 32/96  soft-tissue]
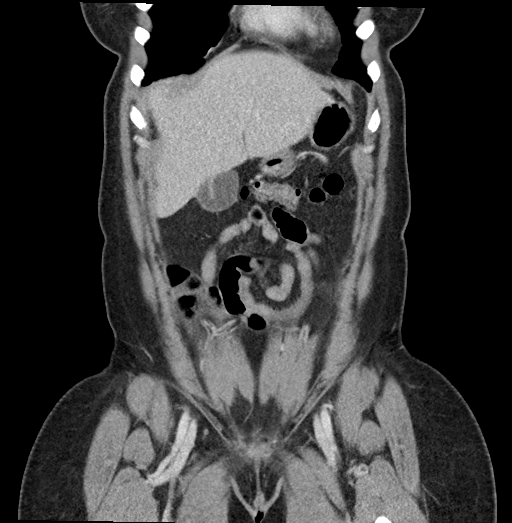
[im 43/96  soft-tissue]
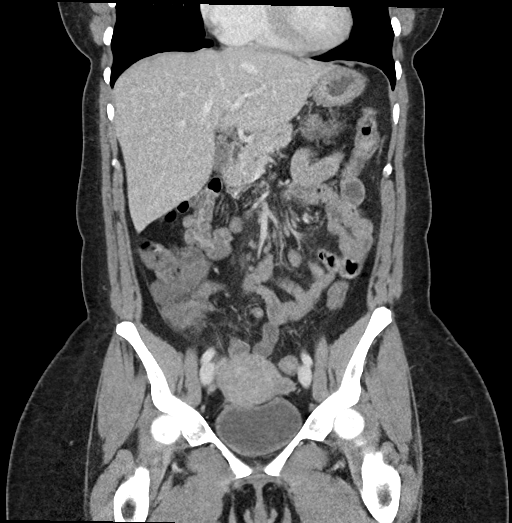
[im 53/96  soft-tissue]
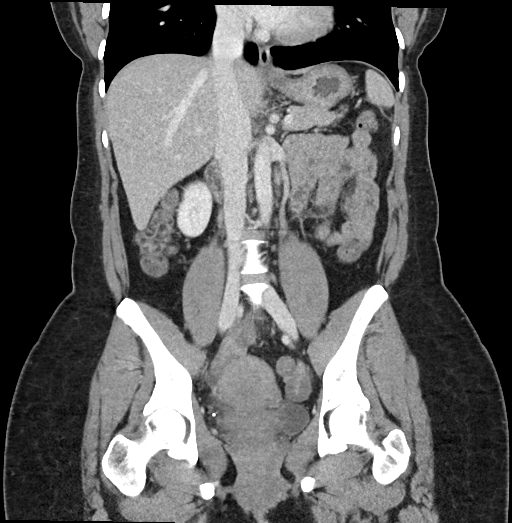

[16 of 46 positions shown; findings below may reference images not displayed]

FINDINGS: Lower chest:  No contributory findings.

Hepatobiliary: No focal liver abnormality.No evidence of biliary
obstruction or stone.

Pancreas: Unremarkable.

Spleen: Unremarkable.

Adrenals/Urinary Tract: 14 mm right adrenal nodule with homogeneous
appearance. No chart history of malignancy. Tiny bilateral renal
cystic densities. No hydronephrosis or stone. Unremarkable bladder.

Stomach/Bowel: Thick wall appendix with mesoappendiceal stranding.
Maximal appendiceal thickness is 19 mm. An appendicolith is present
at the base. The cecum shows edema. No perforation or abscess noted.

Vascular/Lymphatic: No acute vascular abnormality. No mass or
adenopathy.

Reproductive:No pathologic findings.  Corpus luteum on the left.

Other: Trace reactive appearing pelvic fluid.

Musculoskeletal: No acute abnormalities.
IMPRESSION: 1. Acute appendicitis without perforation. An appendicolith is
present.
2. 14 mm right adrenal nodule, statistically an adenoma.

## 2022-08-03 ENCOUNTER — Other Ambulatory Visit: Payer: Self-pay | Admitting: Internal Medicine

## 2022-08-09 ENCOUNTER — Telehealth: Payer: 59 | Admitting: Nurse Practitioner

## 2022-08-09 DIAGNOSIS — B9789 Other viral agents as the cause of diseases classified elsewhere: Secondary | ICD-10-CM | POA: Diagnosis not present

## 2022-08-09 DIAGNOSIS — J329 Chronic sinusitis, unspecified: Secondary | ICD-10-CM | POA: Diagnosis not present

## 2022-08-09 MED ORDER — FLUTICASONE PROPIONATE 50 MCG/ACT NA SUSP: 2.0000 | Freq: Every day | NASAL | 6 refills | Status: DC

## 2022-08-09 NOTE — Progress Notes (Signed)
E-Visit for Sinus Problems  We are sorry that you are not feeling well.  Here is how we plan to help!  Based on what you have shared with me it looks like you have sinusitis.  Sinusitis is inflammation and infection in the sinus cavities of the head.  Based on your presentation I believe you most likely have Acute Viral Sinusitis.This is an infection most likely caused by a virus. There is not specific treatment for viral sinusitis other than to help you with the symptoms until the infection runs its course.  You may use an oral decongestant such as Mucinex D or if you have glaucoma or high blood pressure use plain Mucinex. Saline nasal spray help and can safely be used as often as needed for congestion, I have prescribed: Fluticasone nasal spray two sprays in each nostril once a day.  Providers prescribe antibiotics to treat infections caused by bacteria. Antibiotics are very powerful in treating bacterial infections when they are used properly. To maintain their effectiveness, they should be used only when necessary. Overuse of antibiotics has resulted in the development of superbugs that are resistant to treatment!    After careful review of your answers, I would not recommend an antibiotic for your condition.  Antibiotics are not effective against viruses and therefore should not be used to treat them. Common examples of infections caused by viruses include colds and flu   Some authorities believe that zinc sprays or the use of Echinacea may shorten the course of your symptoms.  Sinus infections are not as easily transmitted as other respiratory infection, however we still recommend that you avoid close contact with loved ones, especially the very young and elderly.  Remember to wash your hands thoroughly throughout the day as this is the number one way to prevent the spread of infection!  Home Care: Only take medications as instructed by your medical team. Do not take these medications with  alcohol. A steam or ultrasonic humidifier can help congestion.  You can place a towel over your head and breathe in the steam from hot water coming from a faucet. Avoid close contacts especially the very young and the elderly. Cover your mouth when you cough or sneeze. Always remember to wash your hands.  Get Help Right Away If: You develop worsening fever or sinus pain. You develop a severe head ache or visual changes. Your symptoms persist after you have completed your treatment plan.  Make sure you Understand these instructions. Will watch your condition. Will get help right away if you are not doing well or get worse.   Thank you for choosing an e-visit.  Your e-visit answers were reviewed by a board certified advanced clinical practitioner to complete your personal care plan. Depending upon the condition, your plan could have included both over the counter or prescription medications.  Please review your pharmacy choice. Make sure the pharmacy is open so you can pick up prescription now. If there is a problem, you may contact your provider through MyChart messaging and have the prescription routed to another pharmacy.  Your safety is important to us. If you have drug allergies check your prescription carefully.   For the next 24 hours you can use MyChart to ask questions about today's visit, request a non-urgent call back, or ask for a work or school excuse. You will get an email in the next two days asking about your experience. I hope that your e-visit has been valuable and will speed your recovery.    Meds ordered this encounter  Medications   fluticasone (FLONASE) 50 MCG/ACT nasal spray    Sig: Place 2 sprays into both nostrils daily.    Dispense:  16 g    Refill:  6    I spent approximately 5 minutes reviewing the patient's history, current symptoms and coordinating their plan of care today.   

## 2022-08-24 ENCOUNTER — Encounter: Payer: 59 | Admitting: Internal Medicine

## 2022-09-08 ENCOUNTER — Ambulatory Visit: Payer: 59 | Admitting: Internal Medicine

## 2022-09-19 ENCOUNTER — Ambulatory Visit: Payer: 59 | Admitting: Internal Medicine

## 2022-09-28 ENCOUNTER — Telehealth: Payer: 59 | Admitting: Emergency Medicine

## 2022-09-28 DIAGNOSIS — M545 Low back pain, unspecified: Secondary | ICD-10-CM | POA: Diagnosis not present

## 2022-09-28 MED ORDER — CYCLOBENZAPRINE HCL 10 MG PO TABS: 10.0000 mg | ORAL_TABLET | Freq: Three times a day (TID) | ORAL | 0 refills | Status: DC | PRN

## 2022-09-28 MED ORDER — NAPROXEN 500 MG PO TABS: 500.0000 mg | ORAL_TABLET | Freq: Two times a day (BID) | ORAL | 0 refills | Status: AC

## 2022-09-28 NOTE — Progress Notes (Signed)
We are sorry that you are not feeling well.  Here is how we plan to help!  Based on what you have shared with me it looks like you mostly have acute back pain.  Acute back pain is defined as musculoskeletal pain that can resolve in 1-3 weeks with conservative treatment.  You can continue taking ibuprofen at home or I have prescribed Naprosyn 500 mg take one by mouth twice a day non-steroid anti-inflammatory (NSAID), but please do not take both.  I have also prescribed  Flexeril 10 mg every eight hours as needed which is a muscle relaxer  Some patients experience stomach irritation or in increased heartburn with anti-inflammatory drugs.  Please keep in mind that muscle relaxer's can cause fatigue and should not be taken while at work or driving.  Back pain is very common.  The pain often gets better over time.  The cause of back pain is usually not dangerous.  Most people can learn to manage their back pain on their own.  Home Care Stay active.  Start with short walks on flat ground if you can.  Try to walk farther each day. Do not sit, drive or stand in one place for more than 30 minutes.  Do not stay in bed. Do not avoid exercise or work.  Activity can help your back heal faster. Be careful when you bend or lift an object.  Bend at your knees, keep the object close to you, and do not twist. Sleep on a firm mattress.  Lie on your side, and bend your knees.  If you lie on your back, put a pillow under your knees. Only take medicines as told by your doctor. Put ice on the injured area. Put ice in a plastic bag Place a towel between your skin and the bag Leave the ice on for 15-20 minutes, 3-4 times a day for the first 2-3 days. 210 After that, you can switch between ice and heat packs. Ask your doctor about back exercises or massage. Avoid feeling anxious or stressed.  Find good ways to deal with stress, such as exercise.  Get Help Right Way If: Your pain does not go away with rest or  medicine. Your pain does not go away in 1 week. You have new problems. You do not feel well. The pain spreads into your legs. You cannot control when you poop (bowel movement) or pee (urinate) You feel sick to your stomach (nauseous) or throw up (vomit) You have belly (abdominal) pain. You feel like you may pass out (faint). If you develop a fever.  Make Sure you: Understand these instructions. Will watch your condition Will get help right away if you are not doing well or get worse.  Your e-visit answers were reviewed by a board certified advanced clinical practitioner to complete your personal care plan.  Depending on the condition, your plan could have included both over the counter or prescription medications.  If there is a problem please reply  once you have received a response from your provider.  Your safety is important to [place].  If you have drug allergies check your prescription carefully.    You can use MyChart to ask questions about today's visit, request a non-urgent call back, or ask for a work or school excuse for 24 hours related to this e-Visit. If it has been greater than 24 hours you will need to follow up with your provider, or enter a new e-Visit to address those concerns.  You  will get an e-mail in the next two days asking about your experience.  I hope that your e-visit has been valuable and will speed your recovery. Thank you for using e-visits.  I have spent 5 minutes in review of e-visit questionnaire, review and updating patient chart, medical decision making and response to patient.   Rica Mast, PhD, FNP-BC

## 2022-10-03 ENCOUNTER — Ambulatory Visit: Payer: 59 | Admitting: Internal Medicine

## 2022-10-04 ENCOUNTER — Ambulatory Visit: Payer: 59 | Admitting: Internal Medicine

## 2022-10-06 ENCOUNTER — Encounter: Payer: Self-pay | Admitting: Internal Medicine

## 2022-10-11 ENCOUNTER — Ambulatory Visit: Payer: 59 | Admitting: Internal Medicine

## 2022-10-16 ENCOUNTER — Encounter: Payer: Self-pay | Admitting: Internal Medicine

## 2022-10-16 NOTE — Progress Notes (Signed)
      Subjective:    Patient ID: [name], female    DOB: 02/03/1976, 46 y.o.   MRN: [id number]     HPI Zafira is here for follow up of her chronic medical problems, including htn, prediabetes, PMDD, anxiety, insomnia    Medications and allergies reviewed with patient and updated if appropriate.  Current Outpatient Medications on File Prior to Visit  Medication Sig Dispense Refill  . ALPRAZolam (XANAX) 0.5 MG tablet TAKE 0.5-1 TABLETS (0.25-0.5 MG TOTAL) BY MOUTH DAILY AS NEEDED FOR ANXIETY 30 tablet 2  . ASHWAGANDHA PO Take 1 tablet by mouth daily as needed (blood pressure).    Marland Kitchen buPROPion (WELLBUTRIN XL) 150 MG 24 hr tablet Take 1 tablet (150 mg total) by mouth every morning.    . Cholecalciferol (VITAMIN D) 50 MCG (2000 UT) tablet Take 2,000 Units by mouth every other day.    . Cyanocobalamin (B-12) 1000 MCG CAPS Takes one daily (Patient taking differently: Take 1 tablet by mouth daily.)    . cyclobenzaprine (FLEXERIL) 10 MG tablet Take 1 tablet (10 mg total) by mouth 3 (three) times daily as needed for muscle spasms. 30 tablet 0  . escitalopram (LEXAPRO) 10 MG tablet TAKE 1 AND 1/2 TABLETS BY MOUTH DAILY 135 tablet 1  . eszopiclone (LUNESTA) 2 MG TABS tablet TAKE 1 TABLET (2 MG TOTAL) BY MOUTH IMMEDIATELY BEFORE BEDTIME AS NEEDED FOR SLEEP 30 tablet 3  . Ferrous Sulfate (IRON PO) Take 1 tablet by mouth every other day.    . fluticasone (FLONASE) 50 MCG/ACT nasal spray Place 2 sprays into both nostrils daily. 16 g 6  . hydrochlorothiazide (HYDRODIURIL) 25 MG tablet TAKE 1 TABLET BY MOUTH EVERY DAY 90 tablet 1  . Magnesium 300 MG CAPS Take by mouth.    . valsartan (DIOVAN) 80 MG tablet TAKE 1 TABLET BY MOUTH EVERY DAY 90 tablet 1   No current facility-administered medications on file prior to visit.     Review of Systems     Objective:  There were no vitals filed for this visit. BP Readings from Last 3 Encounters:  02/22/22 124/74  08/12/21 120/84  02/21/21  130/87   Wt Readings from Last 3 Encounters:  02/22/22 178 lb (80.7 kg)  08/12/21 177 lb (80.3 kg)  02/19/21 173 lb (78.5 kg)   There is no height or weight on file to calculate BMI.    Physical Exam     Lab Results  Component Value Date   WBC 9.0 08/12/2021   HGB 12.0 08/12/2021   HCT 37.3 08/12/2021   PLT 266.0 08/12/2021   GLUCOSE 89 02/22/2022   CHOL 186 01/14/2020   TRIG 70.0 01/14/2020   HDL 57.50 01/14/2020   LDLDIRECT 149.5 09/25/2013   LDLCALC 115 (H) 01/14/2020   ALT 30 02/22/2022   AST 21 02/22/2022   NA 136 02/22/2022   K 3.8 02/22/2022   CL 102 02/22/2022   CREATININE 0.78 02/22/2022   BUN 14 02/22/2022   CO2 28 02/22/2022   TSH 1.19 01/14/2020   HGBA1C 5.9 02/22/2022     Assessment & Plan:    See Problem List for Assessment and Plan of chronic medical problems.    This encounter was created in error - please disregard.

## 2022-10-16 NOTE — Patient Instructions (Addendum)
      Blood work was ordered.   The lab is on the first floor.    Medications changes include :   none     Return in about 6 months (around 04/18/2023) for Physical Exam.  

## 2022-10-17 ENCOUNTER — Encounter: Payer: 59 | Admitting: Internal Medicine

## 2022-10-17 DIAGNOSIS — I10 Essential (primary) hypertension: Secondary | ICD-10-CM

## 2022-10-17 DIAGNOSIS — F410 Panic disorder [episodic paroxysmal anxiety] without agoraphobia: Secondary | ICD-10-CM

## 2022-10-17 DIAGNOSIS — F3281 Premenstrual dysphoric disorder: Secondary | ICD-10-CM

## 2022-10-17 DIAGNOSIS — G4709 Other insomnia: Secondary | ICD-10-CM

## 2022-10-17 DIAGNOSIS — R7303 Prediabetes: Secondary | ICD-10-CM

## 2022-10-17 NOTE — Assessment & Plan Note (Signed)
Chronic Controlled, stable Continue lexapro 15 mg daily

## 2022-10-17 NOTE — Assessment & Plan Note (Signed)
Chronic Controlled, stable Continue melatonin Continue lunesta 2 mg nightly as needed

## 2022-10-17 NOTE — Assessment & Plan Note (Signed)
Chronic BP well controlled Continue hctz 25 mg daily, diovan 80 mg daily cmp

## 2022-10-17 NOTE — Assessment & Plan Note (Signed)
Chronic  Following with gyn Controlled Continue lexparo 15 mg daily, wellburin xl 150 mg daily, xanax 0.25-0.5 mg daily prn Continue regular exercise, healthy diet

## 2022-10-17 NOTE — Assessment & Plan Note (Signed)
Chronic Check a1c Low sugar / carb diet Stressed regular exercise  

## 2022-10-18 ENCOUNTER — Ambulatory Visit: Payer: 59 | Admitting: Internal Medicine

## 2022-12-02 ENCOUNTER — Ambulatory Visit: Payer: 59 | Admitting: Internal Medicine

## 2022-12-02 ENCOUNTER — Other Ambulatory Visit: Payer: Self-pay | Admitting: Internal Medicine

## 2022-12-06 ENCOUNTER — Encounter: Payer: Self-pay | Admitting: Internal Medicine

## 2022-12-06 NOTE — Patient Instructions (Addendum)
      Blood work was ordered.   The lab is on the first floor.    Medications changes include :     Continue lexparo 10 mg daily and start sertraline 25 mg daily In two weeks decrease lexapro to 5 mg daily and increase sertraline to 50 mg daily.  After one week stop the lexapro.      Return in about 6 months (around 06/07/2023) for Physical Exam.

## 2022-12-06 NOTE — Progress Notes (Unsigned)
Subjective:    Patient ID: [name], female    DOB: 11-05-1975, 47 y.o.   MRN: [id number]     HPI [name] is here for follow up of her chronic medical problems, including htn, prediabetes, anxiety, PMDD, insomnia  Anxiety - has had increased anxiety.  Sleep has not been as good with the increased anxiety.  She is taking the lunesta.  Sex drive is significantly decreased secondary to the Lexapro and she wondered what other choices that were.  In the past when she is coming off the Lexapro she has had side effects-gets anxiety, HA, irritable   Exercising almost daily.  Eating plant based.    Medications and allergies reviewed with patient and updated if appropriate.  Current Outpatient Medications on File Prior to Visit  Medication Sig Dispense Refill   ALPRAZolam (XANAX) 0.5 MG tablet TAKE 0.5-1 TABLETS (0.25-0.5 MG TOTAL) BY MOUTH DAILY AS NEEDED FOR ANXIETY 30 tablet 0   ASHWAGANDHA PO Take 1 tablet by mouth daily as needed (blood pressure).     Cholecalciferol (VITAMIN D) 50 MCG (2000 UT) tablet Take 2,000 Units by mouth every other day.     Cyanocobalamin (B-12) 1000 MCG CAPS Takes one daily (Patient taking differently: Take 1 tablet by mouth daily.)     cyclobenzaprine (FLEXERIL) 10 MG tablet Take 1 tablet (10 mg total) by mouth 3 (three) times daily as needed for muscle spasms. 30 tablet 0   escitalopram (LEXAPRO) 10 MG tablet TAKE 1 AND 1/2 TABLETS BY MOUTH DAILY 135 tablet 1   eszopiclone (LUNESTA) 2 MG TABS tablet TAKE 1 TABLET (2 MG TOTAL) BY MOUTH IMMEDIATELY BEFORE BEDTIME AS NEEDED FOR SLEEP 30 tablet 3   Ferrous Sulfate (IRON PO) Take 1 tablet by mouth every other day.     fluticasone (FLONASE) 50 MCG/ACT nasal spray Place 2 sprays into both nostrils daily. 16 g 6   hydrochlorothiazide (HYDRODIURIL) 25 MG tablet TAKE 1 TABLET BY MOUTH EVERY DAY 90 tablet 1   Magnesium 300 MG CAPS Take by mouth.     valsartan (DIOVAN) 80 MG tablet TAKE 1 TABLET BY MOUTH  EVERY DAY 90 tablet 1   No current facility-administered medications on file prior to visit.     Review of Systems  Constitutional:  Negative for fever.  Respiratory:  Negative for cough, shortness of breath and wheezing.   Cardiovascular:  Negative for chest pain, palpitations and leg swelling.  Neurological:  Negative for light-headedness and headaches.  Psychiatric/Behavioral:  Positive for sleep disturbance. The patient is nervous/anxious.        Objective:   Vitals:   12/07/22 1322  BP: 120/78  Pulse: (!) 107  Temp: 98.6 F (37 C)  SpO2: 99%   BP Readings from Last 3 Encounters:  12/07/22 120/78  02/22/22 124/74  08/12/21 120/84   Wt Readings from Last 3 Encounters:  12/07/22 169 lb (76.7 kg)  02/22/22 178 lb (80.7 kg)  08/12/21 177 lb (80.3 kg)   Body mass index is 29.94 kg/m.    Physical Exam Constitutional:      General: She is not in acute distress.    Appearance: Normal appearance.  HENT:     Head: Normocephalic and atraumatic.  Eyes:     Conjunctiva/sclera: Conjunctivae normal.  Cardiovascular:     Rate and Rhythm: Normal rate and regular rhythm.     Heart sounds: Normal heart sounds. No murmur heard. Pulmonary:     Effort: Pulmonary effort is  normal. No respiratory distress.     Breath sounds: Normal breath sounds. No wheezing.  Musculoskeletal:     Cervical back: Neck supple.     Right lower leg: No edema.     Left lower leg: No edema.  Lymphadenopathy:     Cervical: No cervical adenopathy.  Skin:    General: Skin is warm and dry.     Findings: No rash.  Neurological:     Mental Status: She is alert. Mental status is at baseline.  Psychiatric:        Mood and Affect: Mood normal.        Behavior: Behavior normal.        Lab Results  Component Value Date   WBC 9.0 08/12/2021   HGB 12.0 08/12/2021   HCT 37.3 08/12/2021   PLT 266.0 08/12/2021   GLUCOSE 89 02/22/2022   CHOL 186 01/14/2020   TRIG 70.0 01/14/2020   HDL 57.50  01/14/2020   LDLDIRECT 149.5 09/25/2013   LDLCALC 115 (H) 01/14/2020   ALT 30 02/22/2022   AST 21 02/22/2022   NA 136 02/22/2022   K 3.8 02/22/2022   CL 102 02/22/2022   CREATININE 0.78 02/22/2022   BUN 14 02/22/2022   CO2 28 02/22/2022   TSH 1.19 01/14/2020   HGBA1C 5.9 02/22/2022     Assessment & Plan:    See Problem List for Assessment and Plan of chronic medical problems.

## 2022-12-07 ENCOUNTER — Encounter: Payer: Self-pay | Admitting: Internal Medicine

## 2022-12-07 ENCOUNTER — Ambulatory Visit (INDEPENDENT_AMBULATORY_CARE_PROVIDER_SITE_OTHER): Payer: 59 | Admitting: Internal Medicine

## 2022-12-07 VITALS — BP 120/78 | HR 107 | Temp 98.6°F | Ht 63.0 in | Wt 169.0 lb

## 2022-12-07 DIAGNOSIS — I10 Essential (primary) hypertension: Secondary | ICD-10-CM

## 2022-12-07 DIAGNOSIS — R7303 Prediabetes: Secondary | ICD-10-CM

## 2022-12-07 DIAGNOSIS — Z1159 Encounter for screening for other viral diseases: Secondary | ICD-10-CM

## 2022-12-07 DIAGNOSIS — F410 Panic disorder [episodic paroxysmal anxiety] without agoraphobia: Secondary | ICD-10-CM

## 2022-12-07 DIAGNOSIS — R6882 Decreased libido: Secondary | ICD-10-CM

## 2022-12-07 LAB — LIPID PANEL
Cholesterol: 251 mg/dL — ABNORMAL HIGH (ref 0–200)
HDL: 74.7 mg/dL (ref >39.00)
LDL Cholesterol: 163 mg/dL — ABNORMAL HIGH (ref 0–99)
NonHDL: 176.64
Total CHOL/HDL Ratio: 3
Triglycerides: 70 mg/dL (ref 0.0–149.0)
VLDL: 14 mg/dL (ref 0.0–40.0)

## 2022-12-07 LAB — COMPREHENSIVE METABOLIC PANEL
ALT: 24 U/L (ref 0–35)
AST: 23 U/L (ref 0–37)
Albumin: 4.8 g/dL (ref 3.5–5.2)
Alkaline Phosphatase: 67 U/L (ref 39–117)
BUN: 9 mg/dL (ref 6–23)
CO2: 30 mEq/L (ref 19–32)
Calcium: 9.9 mg/dL (ref 8.4–10.5)
Chloride: 98 mEq/L (ref 96–112)
Creatinine, Ser: 0.77 mg/dL (ref 0.40–1.20)
GFR: 93.99 mL/min (ref >60.00)
Glucose, Bld: 96 mg/dL (ref 70–99)
Potassium: 4 mEq/L (ref 3.5–5.1)
Sodium: 139 mEq/L (ref 135–145)
Total Bilirubin: 0.5 mg/dL (ref 0.2–1.2)
Total Protein: 8 g/dL (ref 6.0–8.3)

## 2022-12-07 LAB — HEMOGLOBIN A1C: Hgb A1c MFr Bld: 5.7 % (ref 4.6–6.5)

## 2022-12-07 MED ORDER — SERTRALINE HCL 25 MG PO TABS: ORAL_TABLET | ORAL | 5 refills | Status: DC

## 2022-12-07 NOTE — Assessment & Plan Note (Signed)
Chronic Secondary to Lexapro mostly Will try tapering her off the Lexapro and starting sertraline to see if that helps

## 2022-12-07 NOTE — Assessment & Plan Note (Signed)
Chronic Check a1c, lipid panel Low sugar / carb diet Stressed regular exercise

## 2022-12-07 NOTE — Assessment & Plan Note (Signed)
Chronic Having increased anxiety, but also not doing great with Lexapro because it is decreasing her libido which is affecting the quality of her life She currently takes 10 mg of Lexapro and then 15-20 mg 2 weeks prior to her. Since she is on 10 mg of Lexapro now she will continue that for the next 2 weeks and then decrease to 5 mg for 1 week and then stop it Start sertraline 25 mg daily for 2 weeks and then increase to 50 mg daily She will let me know if she has any side effects so we can adjust the dosing of either medication Xanax 0.25-0.5 mg daily as needed

## 2022-12-07 NOTE — Assessment & Plan Note (Signed)
Chronic Blood pressure well controlled CMP Continue hydrochlorothiazide 25 mg daily, valsartan 80 mg daily

## 2022-12-08 LAB — HEPATITIS C ANTIBODY: Hepatitis C Ab: NONREACTIVE

## 2022-12-17 ENCOUNTER — Encounter: Payer: Self-pay | Admitting: Urgent Care

## 2022-12-17 ENCOUNTER — Telehealth: Payer: Self-pay | Admitting: Urgent Care

## 2022-12-17 DIAGNOSIS — R6889 Other general symptoms and signs: Secondary | ICD-10-CM

## 2022-12-17 MED ORDER — BENZONATATE 100 MG PO CAPS: 100.0000 mg | ORAL_CAPSULE | Freq: Three times a day (TID) | ORAL | 0 refills | Status: DC | PRN

## 2022-12-17 NOTE — Progress Notes (Signed)
E visit for Flu like symptoms   We are sorry that you are not feeling well.  Here is how we plan to help! Based on what you have shared with me it looks like you may have flu-like symptoms that should be watched but do not seem to indicate anti-viral treatment.  Influenza or "the flu" is   an infection caused by a respiratory virus. The flu virus is highly contagious and persons who did not receive their yearly flu vaccination may "catch" the flu from close contact.  We have anti-viral medications to treat the viruses that cause this infection. They are not a "cure" and only shorten the course of the infection. These prescriptions are most effective when they are given within the first 2 days of "flu" symptoms. Antiviral medication are indicated if you have a high risk of complications from the flu. You should  also consider an antiviral medication if you are in close contact with someone who is at risk. These medications can help patients avoid complications from the flu  but have side effects that you should know. Possible side effects from Tamiflu or oseltamivir include nausea, vomiting, diarrhea, dizziness, headaches, eye redness, sleep problems or other respiratory symptoms. You should not take Tamiflu if you have an allergy to oseltamivir or any to the ingredients in Tamiflu.  Based upon your symptoms and potential risk factors I recommend that you follow the flu symptoms recommendation that I have listed below.  Purchase Oscillococcinum over the counter to help with fatigue and body aches.  You may take benzonatate up to three times daily as needed for cough. Please alternate ibuprofen 638m every 4 hours with tylenol 10082mto help with fever and aches. You do not meet treatment criteria for antiviral therapy at this time.  ANYONE WHO HAS FLU SYMPTOMS SHOULD: Stay home. The flu is highly contagious and going out or to work exposes others! Be sure to drink plenty of fluids. Water is fine as  well as fruit juices, sodas and electrolyte beverages. You may want to stay away from caffeine or alcohol. If you are nauseated, try taking small sips of liquids. How do you know if you are getting enough fluid? Your urine should be a pale yellow or almost colorless. Get rest. Taking a steamy shower or using a humidifier may help nasal congestion and ease sore throat pain. Using a saline nasal spray works much the same way. Cough drops, hard candies and sore throat lozenges may ease your cough. Line up a caregiver. Have someone check on you regularly.   GET HELP RIGHT AWAY IF: You cannot keep down liquids or your medications. You become short of breath Your fell like you are going to pass out or loose consciousness. Your symptoms persist after you have completed your treatment plan MAKE SURE YOU  Understand these instructions. Will watch your condition. Will get help right away if you are not doing well or get worse.  Your e-visit answers were reviewed by a board certified advanced clinical practitioner to complete your personal care plan.  Depending on the condition, your plan could have included both over the counter or prescription medications.  If there is a problem please reply  once you have received a response from your provider.  Your safety is important to [place] If you have drug allergies check your prescription carefully.    You can use MyChart to ask questions about today's visit, request a non-urgent call back, or ask for a work  or school excuse for 24 hours related to this e-Visit. If it has been greater than 24 hours you will need to follow up with your provider, or enter a new e-Visit to address those concerns.  You will get an e-mail in the next two days asking about your experience.  I hope that your e-visit has been valuable and will speed your recovery. Thank you for using e-visits.   I have spent 5 minutes in review of e-visit questionnaire, review and updating patient  chart, medical decision making and response to patient.   Grimes, PA

## 2023-01-02 ENCOUNTER — Other Ambulatory Visit: Payer: Self-pay | Admitting: Internal Medicine

## 2023-01-10 ENCOUNTER — Other Ambulatory Visit: Payer: Self-pay | Admitting: Internal Medicine

## 2023-01-22 ENCOUNTER — Telehealth: Payer: 59 | Admitting: Family

## 2023-01-22 DIAGNOSIS — M546 Pain in thoracic spine: Secondary | ICD-10-CM

## 2023-01-22 MED ORDER — NAPROXEN 500 MG PO TABS: 500.0000 mg | ORAL_TABLET | Freq: Two times a day (BID) | ORAL | 0 refills | Status: DC

## 2023-01-22 MED ORDER — BACLOFEN 10 MG PO TABS: 10.0000 mg | ORAL_TABLET | Freq: Three times a day (TID) | ORAL | 0 refills | Status: DC

## 2023-01-22 NOTE — Progress Notes (Signed)

## 2023-04-13 ENCOUNTER — Telehealth: Payer: 59 | Admitting: Physician Assistant

## 2023-04-13 DIAGNOSIS — B3731 Acute candidiasis of vulva and vagina: Secondary | ICD-10-CM | POA: Diagnosis not present

## 2023-04-13 MED ORDER — FLUCONAZOLE 150 MG PO TABS: 150.0000 mg | ORAL_TABLET | Freq: Once | ORAL | 0 refills | Status: AC

## 2023-04-13 NOTE — Progress Notes (Signed)

## 2023-04-13 NOTE — Progress Notes (Signed)
I have spent 5 minutes in review of e-visit questionnaire, review and updating patient chart, medical decision making and response to patient.   Treven Holtman Cody Shahara Hartsfield, PA-C    

## 2023-04-24 ENCOUNTER — Other Ambulatory Visit: Payer: Self-pay | Admitting: Internal Medicine

## 2023-05-19 ENCOUNTER — Telehealth: Payer: 59 | Admitting: Nurse Practitioner

## 2023-05-19 DIAGNOSIS — U071 COVID-19: Secondary | ICD-10-CM

## 2023-05-19 MED ORDER — BENZONATATE 100 MG PO CAPS: 100.0000 mg | ORAL_CAPSULE | Freq: Three times a day (TID) | ORAL | 0 refills | Status: DC | PRN

## 2023-05-19 MED ORDER — FLUTICASONE PROPIONATE 50 MCG/ACT NA SUSP: 2.0000 | Freq: Every day | NASAL | 6 refills | Status: DC

## 2023-05-19 NOTE — Progress Notes (Signed)
E-Visit  for Positive Covid Test Result   We are sorry you are not feeling well. We are here to help!  You have tested positive for COVID-19, meaning that you were infected with the novel coronavirus and could give the virus to others.  Most people with COVID-19 have mild illness and can recover at home without medical care. Do not leave your home, except to get medical care. Do not visit public areas and do not go to places where you are unable to wear a mask. It is important that you stay home  to take care for yourself and to help protect other people in your home and community.      Isolation Instructions:   You are to isolate at home until you have been fever free for at least 24 hours without a fever-reducing medication, and symptoms have been steadily improving for 24 hours. At that time,  you can end isolation but need to mask for an additional 5 days.  If you must be around other household members who do not have symptoms, you need to make sure that both you and the family members are masking consistently with a high-quality mask.  If you note any worsening of symptoms despite treatment, please seek an in-person evaluation ASAP. If you note any significant shortness of breath or any chest pain, please seek ER evaluation. Please do not delay care!   Go to the nearest hospital ED for assessment if fever/cough/breathlessness are severe or illness seems like a threat to life.    The following symptoms may appear 2-14 days after exposure: Fever Cough Shortness of breath or difficulty breathing Chills Repeated shaking with chills Muscle pain Headache Sore throat New loss of taste or smell Fatigue Congestion or runny nose Nausea or vomiting Diarrhea  You can use medication such as prescription cough medication called Tessalon Perles 100 mg. You may take 1-2 capsules every 8 hours as needed for cough and prescription for Fluticasone nasal spray 2 sprays in each nostril one time  per day  You may also take acetaminophen (Tylenol) as needed for fever.  HOME CARE: Only take medications as instructed by your medical team. Drink plenty of fluids and get plenty of rest. A steam or ultrasonic humidifier can help if you have congestion.   GET HELP RIGHT AWAY IF YOU HAVE EMERGENCY WARNING SIGNS.  Call 911 or proceed to your closest emergency facility if: You develop worsening high fever. Trouble breathing Bluish lips or face Persistent pain or pressure in the chest New confusion Inability to wake or stay awake You cough up blood. Your symptoms become more severe Inability to hold down food or fluids  This list is not all possible symptoms. Contact your medical provider for any symptoms that are severe or concerning to you.   Your e-visit answers were reviewed by a board certified advanced clinical practitioner to complete your personal care plan.  Depending on the condition, your plan could have included both over the counter or prescription medications.  If there is a problem please reply once you have received a response from your provider.  Your safety is important to [place].  If you have drug allergies check your prescription carefully.    You can use MyChart to ask questions about today's visit, request a non-urgent call back, or ask for a work or school excuse for 24 hours related to this e-Visit. If it has been greater than 24 hours you will need to follow up with your  provider, or enter a new e-Visit to address those concerns. You will get an e-mail in the next two days asking about your experience.  I hope that your e-visit has been valuable and will speed your recovery. Thank you for using e-visits.  Meds ordered this encounter  Medications   benzonatate (TESSALON) 100 MG capsule    Sig: Take 1 capsule (100 mg total) by mouth 3 (three) times daily as needed.    Dispense:  30 capsule    Refill:  0   fluticasone (FLONASE) 50 MCG/ACT nasal spray    Sig: Place  2 sprays into both nostrils daily.    Dispense:  16 g    Refill:  6     I spent approximately 5 minutes reviewing the patient's history, current symptoms and coordinating their care today.

## 2023-05-20 ENCOUNTER — Other Ambulatory Visit: Payer: Self-pay | Admitting: Internal Medicine

## 2023-05-24 ENCOUNTER — Telehealth: Payer: 59 | Admitting: Nurse Practitioner

## 2023-05-24 DIAGNOSIS — J0140 Acute pansinusitis, unspecified: Secondary | ICD-10-CM | POA: Diagnosis not present

## 2023-05-24 MED ORDER — AZITHROMYCIN 250 MG PO TABS
ORAL_TABLET | ORAL | 0 refills | Status: AC
Start: 2023-05-24 — End: 2023-05-29

## 2023-05-24 NOTE — Progress Notes (Signed)
Virtual Visit Consent   [name], you are scheduled for a virtual visit with a [facility] provider today. Just as with appointments in the office, your consent must be obtained to participate. Your consent will be active for this visit and any virtual visit you may have with one of our providers in the next 365 days. If you have a MyChart account, a copy of this consent can be sent to you electronically.  As this is a virtual visit, video technology does not allow for your provider to perform a traditional examination. This may limit your provider's ability to fully assess your condition. If your provider identifies any concerns that need to be evaluated in person or the need to arrange testing (such as labs, EKG, etc.), we will make arrangements to do so. Although advances in technology are sophisticated, we cannot ensure that it will always work on either your end or our end. If the connection with a video visit is poor, the visit may have to be switched to a telephone visit. With either a video or telephone visit, we are not always able to ensure that we have a secure connection.  By engaging in this virtual visit, you consent to the provision of healthcare and authorize for your insurance to be billed (if applicable) for the services provided during this visit. Depending on your insurance coverage, you may receive a charge related to this service.  I need to obtain your verbal consent now. Are you willing to proceed with your visit today? [name] has provided verbal consent on 05/24/2023 for a virtual visit (video or telephone). Sandra Simas, FNP  Date: 05/24/2023 6:46 PM  Virtual Visit via Video Note   I, [name], connected with  [name]  ([id number], 1976-06-20) on 05/24/23 at  7:00 PM EDT by a video-enabled telemedicine application and verified that I am speaking with the correct person using two identifiers.  Location: Patient: Virtual Visit Location Patient:  Home Provider: Virtual Visit Location Provider: Home Office   I discussed the limitations of evaluation and management by telemedicine and the availability of in person appointments. The patient expressed understanding and agreed to proceed.    History of Present Illness: [name] is a 47 y.o. who identifies as a female who was assigned female at birth, and is being seen today after testing positive for COVID.   She started having symptoms 05/19/23 Today is day 6 of symptoms   Symptoms have included: headaches, body aches, fever, cough, fatigue She is congested in her sinuses  Sore throat   She feels she has not gotten any better or worse  Fever has resolved   She has been using Flonase and benzonatate for relief    She has had COVID in the past  Denies any complications from past infections   She has been vaccinated for COVID [name])   She feels the sinus congestion is what is bothering her the most  She feels pressure in her head/ear and into her teeth   She has had a sinus infection in the past   Problems:  Patient Active Problem List   Diagnosis Date Noted   Prediabetes 01/15/2020   History of iron deficiency 01/14/2020   Family history of diabetes mellitus (DM) 01/05/2020   Insomnia 12/03/2019   PMDD (premenstrual dysphoric disorder) 05/23/2019   Murmur, cardiac 11/30/2018   Non-celiac gluten sensitivity 11/09/2017   Panic anxiety syndrome 09/25/2013   LIBIDO, DECREASED 09/29/2010   Allergic rhinitis 03/25/2010  HYPERTENSION, BENIGN SYSTEMIC 12/28/2006   OVARIAN CYST 12/28/2006    Allergies:  Allergies  Allergen Reactions   Buspirone Other (See Comments)    Gave her panic attacks   Penicillins Hives and Itching   Doxycycline Itching and Rash   Gluten Meal Anxiety, Nausea Only, Other (See Comments) and Swelling   Tilactase Itching and Rash   Medications:  Current Outpatient Medications:    ALPRAZolam (XANAX) 0.5 MG tablet, TAKE 0.5-1 TABLETS  (0.25-0.5 MG TOTAL) BY MOUTH DAILY AS NEEDED FOR ANXIETY, Disp: 30 tablet, Rfl: 0   ASHWAGANDHA PO, Take 1 tablet by mouth daily as needed (blood pressure)., Disp: , Rfl:    baclofen (LIORESAL) 10 MG tablet, Take 1 tablet (10 mg total) by mouth 3 (three) times daily., Disp: 30 each, Rfl: 0   benzonatate (TESSALON) 100 MG capsule, Take 1 capsule (100 mg total) by mouth 3 (three) times daily as needed for cough., Disp: 20 capsule, Rfl: 0   benzonatate (TESSALON) 100 MG capsule, Take 1 capsule (100 mg total) by mouth 3 (three) times daily as needed., Disp: 30 capsule, Rfl: 0   Cholecalciferol (VITAMIN D) 50 MCG (2000 UT) tablet, Take 2,000 Units by mouth every other day., Disp: , Rfl:    Cyanocobalamin (B-12) 1000 MCG CAPS, Takes one daily (Patient taking differently: Take 1 tablet by mouth daily.), Disp: , Rfl:    cyclobenzaprine (FLEXERIL) 10 MG tablet, Take 1 tablet (10 mg total) by mouth 3 (three) times daily as needed for muscle spasms., Disp: 30 tablet, Rfl: 0   escitalopram (LEXAPRO) 10 MG tablet, TAKE 1 AND 1/2 TABLETS BY MOUTH DAILY, Disp: 135 tablet, Rfl: 1   eszopiclone (LUNESTA) 2 MG TABS tablet, TAKE 1 TABLET (2 MG TOTAL) BY MOUTH IMMEDIATELY BEFORE BEDTIME AS NEEDED FOR SLEEP, Disp: 30 tablet, Rfl: 3   Ferrous Sulfate (IRON PO), Take 1 tablet by mouth every other day., Disp: , Rfl:    fluticasone (FLONASE) 50 MCG/ACT nasal spray, Place 2 sprays into both nostrils daily., Disp: 16 g, Rfl: 6   fluticasone (FLONASE) 50 MCG/ACT nasal spray, Place 2 sprays into both nostrils daily., Disp: 16 g, Rfl: 6   hydrochlorothiazide (HYDRODIURIL) 25 MG tablet, TAKE 1 TABLET BY MOUTH EVERY DAY, Disp: 90 tablet, Rfl: 1   Magnesium 300 MG CAPS, Take by mouth., Disp: , Rfl:    naproxen (NAPROSYN) 500 MG tablet, Take 1 tablet (500 mg total) by mouth 2 (two) times daily with a meal., Disp: 30 tablet, Rfl: 0   sertraline (ZOLOFT) 25 MG tablet, Take 25 mg PO daily x 2 weeks then increase to 50 mg PO daily, Disp:  60 tablet, Rfl: 5   valsartan (DIOVAN) 80 MG tablet, TAKE 1 TABLET BY MOUTH EVERY DAY, Disp: 90 tablet, Rfl: 1  Observations/Objective: Patient is well-developed, well-nourished in no acute distress.  Resting comfortably  at home.  Head is normocephalic, atraumatic.  No labored breathing.  Speech is clear and coherent with logical content.  Patient is alert and oriented at baseline.    Assessment and Plan:   1. Acute non-recurrent pansinusitis  Continue Flonase daily and cough medication as needed - azithromycin (ZITHROMAX) 250 MG tablet; Take 2 tablets on day 1, then 1 tablet daily on days 2 through 5  Dispense: 6 tablet; Refill: 0     Follow Up Instructions: I discussed the assessment and treatment plan with the patient. The patient was provided an opportunity to ask questions and all were answered. The patient agreed  with the plan and demonstrated an understanding of the instructions.  A copy of instructions were sent to the patient via MyChart unless otherwise noted below.    The patient was advised to call back or seek an in-person evaluation if the symptoms worsen or if the condition fails to improve as anticipated.  Time:  I spent 15 minutes with the patient via telehealth technology discussing the above problems/concerns.    Sandra Simas, FNP

## 2023-06-07 ENCOUNTER — Other Ambulatory Visit: Payer: Self-pay | Admitting: Internal Medicine

## 2023-06-07 ENCOUNTER — Ambulatory Visit: Payer: 59 | Admitting: Internal Medicine

## 2023-06-19 ENCOUNTER — Encounter: Payer: Self-pay | Admitting: Internal Medicine

## 2023-06-19 NOTE — Progress Notes (Signed)
      Subjective:    Patient ID: [name], female    DOB: November 12, 1975, 47 y.o.   MRN: [id number]     HPI Azaila is here for follow up of her chronic medical problems.  Tdap, mammo, pap  Medications and allergies reviewed with patient and updated if appropriate.  Current Outpatient Medications on File Prior to Visit  Medication Sig Dispense Refill  . ALPRAZolam (XANAX) 0.5 MG tablet TAKE 0.5-1 TABLETS (0.25-0.5 MG TOTAL) BY MOUTH DAILY AS NEEDED FOR ANXIETY 30 tablet 0  . ASHWAGANDHA PO Take 1 tablet by mouth daily as needed (blood pressure).    . baclofen (LIORESAL) 10 MG tablet Take 1 tablet (10 mg total) by mouth 3 (three) times daily. 30 each 0  . Cholecalciferol (VITAMIN D) 50 MCG (2000 UT) tablet Take 2,000 Units by mouth every other day.    . Cyanocobalamin (B-12) 1000 MCG CAPS Takes one daily (Patient taking differently: Take 1 tablet by mouth daily.)    . cyclobenzaprine (FLEXERIL) 10 MG tablet Take 1 tablet (10 mg total) by mouth 3 (three) times daily as needed for muscle spasms. 30 tablet 0  . eszopiclone (LUNESTA) 2 MG TABS tablet TAKE 1 TABLET (2 MG TOTAL) BY MOUTH IMMEDIATELY BEFORE BEDTIME AS NEEDED FOR SLEEP 30 tablet 0  . Ferrous Sulfate (IRON PO) Take 1 tablet by mouth every other day.    . fluticasone (FLONASE) 50 MCG/ACT nasal spray Place 2 sprays into both nostrils daily. 16 g 6  . hydrochlorothiazide (HYDRODIURIL) 25 MG tablet TAKE 1 TABLET BY MOUTH EVERY DAY 90 tablet 1  . Magnesium 300 MG CAPS Take by mouth.    . naproxen (NAPROSYN) 500 MG tablet Take 1 tablet (500 mg total) by mouth 2 (two) times daily with a meal. 30 tablet 0  . sertraline (ZOLOFT) 25 MG tablet Take 25 mg PO daily x 2 weeks then increase to 50 mg PO daily 60 tablet 5  . valsartan (DIOVAN) 80 MG tablet TAKE 1 TABLET BY MOUTH EVERY DAY 90 tablet 1   No current facility-administered medications on file prior to visit.     Review of Systems     Objective:  There were no vitals  filed for this visit. BP Readings from Last 3 Encounters:  12/07/22 120/78  02/22/22 124/74  08/12/21 120/84   Wt Readings from Last 3 Encounters:  12/07/22 169 lb (76.7 kg)  02/22/22 178 lb (80.7 kg)  08/12/21 177 lb (80.3 kg)   There is no height or weight on file to calculate BMI.    Physical Exam     Lab Results  Component Value Date   WBC 9.0 08/12/2021   HGB 12.0 08/12/2021   HCT 37.3 08/12/2021   PLT 266.0 08/12/2021   GLUCOSE 96 12/07/2022   CHOL 251 (H) 12/07/2022   TRIG 70.0 12/07/2022   HDL 74.70 12/07/2022   LDLDIRECT 149.5 09/25/2013   LDLCALC 163 (H) 12/07/2022   ALT 24 12/07/2022   AST 23 12/07/2022   NA 139 12/07/2022   K 4.0 12/07/2022   CL 98 12/07/2022   CREATININE 0.77 12/07/2022   BUN 9 12/07/2022   CO2 30 12/07/2022   TSH 1.19 01/14/2020   HGBA1C 5.7 12/07/2022     Assessment & Plan:    See Problem List for Assessment and Plan of chronic medical problems.    This encounter was created in error - please disregard.

## 2023-06-19 NOTE — Patient Instructions (Addendum)
      Blood work was ordered.   The lab is on the first floor.    Medications changes include :       A referral was ordered and someone will call you to schedule an appointment.     Return in about 6 months (around 12/21/2023) for Physical Exam.

## 2023-06-20 ENCOUNTER — Encounter: Payer: 59 | Admitting: Internal Medicine

## 2023-06-20 DIAGNOSIS — E7800 Pure hypercholesterolemia, unspecified: Secondary | ICD-10-CM | POA: Insufficient documentation

## 2023-06-20 DIAGNOSIS — G4709 Other insomnia: Secondary | ICD-10-CM

## 2023-06-20 DIAGNOSIS — F410 Panic disorder [episodic paroxysmal anxiety] without agoraphobia: Secondary | ICD-10-CM

## 2023-06-20 DIAGNOSIS — I10 Essential (primary) hypertension: Secondary | ICD-10-CM

## 2023-06-20 DIAGNOSIS — E785 Hyperlipidemia, unspecified: Secondary | ICD-10-CM | POA: Insufficient documentation

## 2023-06-20 DIAGNOSIS — R7303 Prediabetes: Secondary | ICD-10-CM

## 2023-06-20 DIAGNOSIS — F3281 Premenstrual dysphoric disorder: Secondary | ICD-10-CM

## 2023-06-20 NOTE — Assessment & Plan Note (Addendum)
Chronic Controlled At her last visit tapered off Lexapro and started sertraline Continue sertraline 50 mg daily  Continue Xanax 0.25-0.5 mg daily as needed

## 2023-06-20 NOTE — Assessment & Plan Note (Signed)
Chronic Blood pressure well controlled CMP, CBC Continue hydrochlorothiazide 25 mg daily, valsartan 80 mg daily

## 2023-06-20 NOTE — Assessment & Plan Note (Signed)
Chronic  Following with gyn Controlled Continue sertraline 50 mg daily, xanax 0.25-0.5 mg daily prn Continue regular exercise, healthy diet

## 2023-06-20 NOTE — Assessment & Plan Note (Signed)
Chronic Controlled, stable Continue melatonin Continue lunesta 2 mg nightly as needed

## 2023-06-20 NOTE — Assessment & Plan Note (Signed)
Chronic Regular exercise and healthy diet encouraged Check lipid panel  Continue lifestyle control  The 10-year ASCVD risk score (Arnett DK, et al., 2019) is: 1%   Values used to calculate the score:     Age: 47 years     Sex: Female     Is Non-Hispanic African American: Yes     Diabetic: No     Tobacco smoker: No     Systolic Blood Pressure: 120 mmHg     Is BP treated: Yes     HDL Cholesterol: 74.7 mg/dL     Total Cholesterol: 251 mg/dL

## 2023-06-20 NOTE — Assessment & Plan Note (Signed)
Chronic Check a1c, lipid panel Low sugar / carb diet Stressed regular exercise

## 2023-07-20 ENCOUNTER — Other Ambulatory Visit: Payer: Self-pay | Admitting: Internal Medicine

## 2023-07-21 ENCOUNTER — Other Ambulatory Visit: Payer: Self-pay | Admitting: Internal Medicine

## 2023-07-23 NOTE — Telephone Encounter (Signed)
Overdue for follow up   -----   Please call her to schedule a f/u -- only 1/2 of a month was medication was sent to [facility].

## 2023-07-23 NOTE — Telephone Encounter (Signed)
Prescription refused - overdue for appointment.

## 2023-07-24 ENCOUNTER — Encounter: Payer: Self-pay | Admitting: Internal Medicine

## 2023-07-24 ENCOUNTER — Telehealth (INDEPENDENT_AMBULATORY_CARE_PROVIDER_SITE_OTHER): Payer: 59 | Admitting: Internal Medicine

## 2023-07-24 DIAGNOSIS — I10 Essential (primary) hypertension: Secondary | ICD-10-CM | POA: Diagnosis not present

## 2023-07-24 DIAGNOSIS — G4709 Other insomnia: Secondary | ICD-10-CM | POA: Diagnosis not present

## 2023-07-24 DIAGNOSIS — F3281 Premenstrual dysphoric disorder: Secondary | ICD-10-CM

## 2023-07-24 DIAGNOSIS — E7800 Pure hypercholesterolemia, unspecified: Secondary | ICD-10-CM

## 2023-07-24 DIAGNOSIS — F410 Panic disorder [episodic paroxysmal anxiety] without agoraphobia: Secondary | ICD-10-CM | POA: Diagnosis not present

## 2023-07-24 DIAGNOSIS — R7303 Prediabetes: Secondary | ICD-10-CM

## 2023-07-24 MED ORDER — SERTRALINE HCL 100 MG PO TABS: 100.0000 mg | ORAL_TABLET | Freq: Every day | ORAL | 1 refills | Status: DC

## 2023-07-24 NOTE — Assessment & Plan Note (Signed)
Chronic Continue regular exercise Continue diabetic diet Will check A1c at her next in-person visit

## 2023-07-24 NOTE — Progress Notes (Signed)
Virtual Visit via Video Note  I connected with [name] on 07/24/23 at  3:40 PM EDT by a video enabled telemedicine application and verified that I am speaking with the correct person using two identifiers.   I discussed the limitations of evaluation and management by telemedicine and the availability of in person appointments. The patient expressed understanding and agreed to proceed.  Present for the visit:  Myself, Dr Cheryll Cockayne, [name].  The patient is currently in her car parked on the side of the road and I am in the office.    No referring provider.    History of Present Illness: This visit is for follow-up of her chronic medical problems.   She is exercising at gym 3 days a week.  Walking outside.   BP has been controlled  Seeing hormone doctor.  Currently getting testosterone pellets.  Taking her medication as prescribed.  Anxiety has been increased-especially 2 weeks prior to her cycle.  She has not been sleeping well, but that is getting a little bit better.   Review of Systems  Constitutional:  Negative for fever.  Respiratory:  Negative for cough, shortness of breath and wheezing.   Cardiovascular:  Negative for chest pain, palpitations and leg swelling.  Neurological:  Negative for dizziness and headaches.       Social History   Socioeconomic History   Marital status: Married    Spouse name: Not on file   Number of children: Not on file   Years of education: Not on file   Highest education level: Not on file  Occupational History   Not on file  Tobacco Use   Smoking status: Never   Smokeless tobacco: Never   Tobacco comments:    Married, lives with spouse and 3 children ; Christiane Ha and Redbird Smith.   Substance and Sexual Activity   Alcohol use: No   Drug use: No   Sexual activity: Not on file  Other Topics Concern   Not on file  Social History Narrative   Married, 3 kids   Exercise: personal trainer 3/week, exercises on own 2/week    Not working   Social Determinants of Corporate investment banker Strain: Not on file  Food Insecurity: Not on file  Transportation Needs: Not on file  Physical Activity: Not on file  Stress: Not on file  Social Connections: Not on file     Observations/Objective: Appears well in NAD Breathing normally Mood and affect normal  Assessment and Plan:  See Problem List for Assessment and Plan of chronic medical problems.   Follow Up Instructions:    I discussed the assessment and treatment plan with the patient. The patient was provided an opportunity to ask questions and all were answered. The patient agreed with the plan and demonstrated an understanding of the instructions.   The patient was advised to call back or seek an in-person evaluation if the symptoms worsen or if the condition fails to improve as anticipated.    Pincus Sanes, MD

## 2023-07-24 NOTE — Assessment & Plan Note (Signed)
Chronic Continue regular exercise and healthy diet Continue lifestyle control Will check lipids at her in person visit in 6 months

## 2023-07-24 NOTE — Assessment & Plan Note (Signed)
Chronic Ran out of Lunesta then her hormone doctor did start her on trazodone-not sure if that is helping or not She will see which works better [employer] to continue to [place] 2 mg nightly as needed

## 2023-07-24 NOTE — Telephone Encounter (Signed)
Patient had appointment today.

## 2023-07-24 NOTE — Assessment & Plan Note (Signed)
Chronic Blood pressure well-controlled at home Continue valsartan 80 mg daily, hydrochlorothiazide 25 mg daily Monitor BP

## 2023-07-24 NOTE — Assessment & Plan Note (Signed)
Chronic  Following with gyn Controlled Increase sertraline to 100 mg daily, continue xanax 0.25-0.5 mg daily prn Continue regular exercise, healthy diet

## 2023-07-24 NOTE — Assessment & Plan Note (Signed)
Chronic Not ideally controlled Having increased anxiety and as usual it is worse 2 weeks before her menstrual cycle related to PMDD Increase sertraline to 100 mg daily Continue alprazolam 0.25-0.5 mg daily as needed

## 2023-09-06 ENCOUNTER — Other Ambulatory Visit: Payer: Self-pay | Admitting: Internal Medicine

## 2023-09-09 ENCOUNTER — Other Ambulatory Visit: Payer: Self-pay | Admitting: Internal Medicine

## 2023-11-14 ENCOUNTER — Other Ambulatory Visit: Payer: Self-pay | Admitting: Internal Medicine

## 2023-12-27 ENCOUNTER — Other Ambulatory Visit: Payer: Self-pay | Admitting: Internal Medicine

## 2024-04-03 ENCOUNTER — Other Ambulatory Visit: Payer: Self-pay | Admitting: Internal Medicine

## 2024-04-04 ENCOUNTER — Encounter: Admitting: Internal Medicine

## 2024-04-10 ENCOUNTER — Encounter: Admitting: Internal Medicine

## 2024-04-10 ENCOUNTER — Ambulatory Visit: Admitting: Internal Medicine

## 2024-04-10 ENCOUNTER — Encounter: Payer: Self-pay | Admitting: Internal Medicine

## 2024-04-10 VITALS — BP 130/88 | HR 100 | Temp 98.7°F | Ht 63.0 in | Wt 177.0 lb

## 2024-04-10 DIAGNOSIS — I10 Essential (primary) hypertension: Secondary | ICD-10-CM

## 2024-04-10 DIAGNOSIS — Z0000 Encounter for general adult medical examination without abnormal findings: Secondary | ICD-10-CM

## 2024-04-10 DIAGNOSIS — R7303 Prediabetes: Secondary | ICD-10-CM | POA: Diagnosis not present

## 2024-04-10 DIAGNOSIS — G4709 Other insomnia: Secondary | ICD-10-CM

## 2024-04-10 DIAGNOSIS — F3281 Premenstrual dysphoric disorder: Secondary | ICD-10-CM

## 2024-04-10 DIAGNOSIS — E7800 Pure hypercholesterolemia, unspecified: Secondary | ICD-10-CM | POA: Diagnosis not present

## 2024-04-10 DIAGNOSIS — F410 Panic disorder [episodic paroxysmal anxiety] without agoraphobia: Secondary | ICD-10-CM

## 2024-04-10 MED ORDER — ALPRAZOLAM 0.5 MG PO TABS: 0.2500 mg | ORAL_TABLET | Freq: Every day | ORAL | 5 refills | Status: DC | PRN

## 2024-04-10 NOTE — Progress Notes (Signed)
 Subjective:    Patient ID: [name], female    DOB: 07/04/1976, 48 y.o.   MRN: [id number]      HPI [name] is here for a Physical exam and her chronic medical problems.    Still going to [facility] - doing testosterone  pellet therapy.  On oral progesterone.  She was also placed on Armour Thyroid .  Initially she felt well when she started all the hormones, but has not felt well recently.  Her testosterone  level was on the higher side and she is still receiving injection and that concerned her a little bit.  She is perimenopausal so her hormones are all over the place to begin with.  She has noticed some weight gain.  She did not have her menstrual period this month.  Medications and allergies reviewed with patient and updated if appropriate.  Current Outpatient Medications on File Prior to Visit  Medication Sig Dispense Refill   ALPRAZolam  (XANAX ) 0.5 MG tablet TAKE 0.5-1 TABLETS (0.25-0.5 MG TOTAL) BY MOUTH DAILY AS NEEDED FOR ANXIETY 30 tablet 5   ARMOUR THYROID  30 MG tablet Take 30 mg by mouth daily.     ASHWAGANDHA PO Take 1 tablet by mouth daily as needed (blood pressure).     Cholecalciferol (VITAMIN D) 50 MCG (2000 UT) tablet Take 2,000 Units by mouth every other day.     Cyanocobalamin  (B-12) 1000 MCG CAPS Takes one daily (Patient taking differently: Take 1 tablet by mouth daily.)     eszopiclone  (LUNESTA ) 2 MG TABS tablet TAKE 1 TABLET (2 MG TOTAL) BY MOUTH IMMEDIATELY BEFORE BEDTIME AS NEEDED FOR SLEEP 30 tablet 5   Ferrous Sulfate (IRON PO) Take 1 tablet by mouth every other day.     fluticasone  (FLONASE ) 50 MCG/ACT nasal spray Place 2 sprays into both nostrils daily. 16 g 6   hydrochlorothiazide  (HYDRODIURIL ) 25 MG tablet TAKE 1 TABLET BY MOUTH EVERY DAY 30 tablet 0   hydrOXYzine (ATARAX) 25 MG tablet Take 25-50 mg by mouth daily as needed.     Magnesium  300 MG CAPS Take by mouth.     progesterone (PROMETRIUM) 100 MG capsule Take by mouth.     sertraline  (ZOLOFT )  100 MG tablet TAKE 1 TABLET BY MOUTH EVERY DAY 90 tablet 1   valsartan  (DIOVAN ) 80 MG tablet TAKE 1 TABLET BY MOUTH EVERY DAY 90 tablet 1   No current facility-administered medications on file prior to visit.    Review of Systems  Constitutional:  Negative for fever.  Eyes:  Negative for visual disturbance.  Respiratory:  Negative for cough, shortness of breath and wheezing.   Cardiovascular:  Negative for chest pain, palpitations and leg swelling.  Gastrointestinal:  Negative for abdominal pain, blood in stool, constipation and diarrhea.       GERD  Genitourinary:  Negative for dysuria.  Musculoskeletal:  Negative for arthralgias and back pain.  Skin:  Negative for rash.  Neurological:  Negative for light-headedness and headaches.  Psychiatric/Behavioral:  Positive for sleep disturbance. Negative for dysphoric mood. The patient is nervous/anxious.        Objective:   Vitals:   04/10/24 1326  BP: 130/88  Pulse: 100  Temp: 98.7 F (37.1 C)  SpO2: 98%   Filed Weights   04/10/24 1326  Weight: 177 lb (80.3 kg)   Body mass index is 31.35 kg/m.  BP Readings from Last 3 Encounters:  04/10/24 130/88  12/07/22 120/78  02/22/22 124/74    Wt Readings from Last  3 Encounters:  04/10/24 177 lb (80.3 kg)  12/07/22 169 lb (76.7 kg)  02/22/22 178 lb (80.7 kg)       Physical Exam Constitutional: She appears well-developed and well-nourished. No distress.  HENT:  Head: Normocephalic and atraumatic.  Right Ear: External ear normal. Normal ear canal and TM Left Ear: External ear normal.  Normal ear canal and TM Mouth/Throat: Oropharynx is clear and moist.  Eyes: Conjunctivae normal.  Neck: Neck supple. No tracheal deviation present. No thyromegaly present.  No carotid bruit  Cardiovascular: Normal rate, regular rhythm and normal heart sounds.   No murmur heard.  No edema. Pulmonary/Chest: Effort normal and breath sounds normal. No respiratory distress. She has no wheezes.  She has no rales.  Breast: deferred   Abdominal: Soft. She exhibits no distension. There is no tenderness.  Lymphadenopathy: She has no cervical adenopathy.  Skin: Skin is warm and dry. She is not diaphoretic.  Psychiatric: She has a normal mood and affect. Her behavior is normal.     Lab Results  Component Value Date   WBC 9.0 08/12/2021   HGB 12.0 08/12/2021   HCT 37.3 08/12/2021   PLT 266.0 08/12/2021   GLUCOSE 96 12/07/2022   CHOL 251 (H) 12/07/2022   TRIG 70.0 12/07/2022   HDL 74.70 12/07/2022   LDLDIRECT 149.5 09/25/2013   LDLCALC 163 (H) 12/07/2022   ALT 24 12/07/2022   AST 23 12/07/2022   NA 139 12/07/2022   K 4.0 12/07/2022   CL 98 12/07/2022   CREATININE 0.77 12/07/2022   BUN 9 12/07/2022   CO2 30 12/07/2022   TSH 1.19 01/14/2020   HGBA1C 5.7 12/07/2022         Assessment & Plan:   Physical exam: Screening blood work  ordered Exercise  regular Weight  increased Substance abuse  none   Reviewed recommended immunizations.   Health Maintenance  Topic Date Due   Cervical Cancer Screening (HPV/Pap Cotest)  03/25/2013   MAMMOGRAM  01/25/2014   DTaP/Tdap/Td (3 - Td or Tdap) 02/19/2022   COVID-19 Vaccine (3 - 2024-25 season) 07/02/2023   Colonoscopy  Never done   Hepatitis C Screening  Completed   HIV Screening  Completed   HPV VACCINES  Aged Out   Meningococcal B Vaccine  Aged Out   INFLUENZA VACCINE  Discontinued          See Problem List for Assessment and Plan of chronic medical problems.

## 2024-04-10 NOTE — Assessment & Plan Note (Signed)
 Chronic Continue regular exercise and healthy diet Continue lifestyle control Check lipid panel, CMP, CBC, TSH

## 2024-04-10 NOTE — Assessment & Plan Note (Signed)
 Chronic Check TFTs Continue regular exercise Okay to continue to Lunesta  2 mg nightly as needed

## 2024-04-10 NOTE — Assessment & Plan Note (Signed)
 Chronic Controlled Having increased anxiety and as usual it is worse 2 weeks before her menstrual cycle related to PMDD Continue sertraline  to 100 mg daily Continue alprazolam  0.25-0.5 mg daily as needed

## 2024-04-10 NOTE — Assessment & Plan Note (Signed)
 Chronic  Following with gyn Following up [facility]-currently on testosterone  pellets, progesterone Increase sertraline  to 100 mg daily, continue xanax  0.25-0.5 mg daily prn Continue regular exercise, healthy diet

## 2024-04-10 NOTE — Patient Instructions (Addendum)
 Blood work was ordered.       Medications changes include :   None      Return in about 6 months (around 10/10/2024) for follow up.   Health Maintenance, Female Adopting a healthy lifestyle and getting preventive care are important in promoting health and wellness. Ask your health care provider about: The right schedule for you to have regular tests and exams. Things you can do on your own to prevent diseases and keep yourself healthy. What should I know about diet, weight, and exercise? Eat a healthy diet  Eat a diet that includes plenty of vegetables, fruits, low-fat dairy products, and lean protein. Do not eat a lot of foods that are high in solid fats, added sugars, or sodium. Maintain a healthy weight Body mass index (BMI) is used to identify weight problems. It estimates body fat based on height and weight. Your health care provider can help determine your BMI and help you achieve or maintain a healthy weight. Get regular exercise Get regular exercise. This is one of the most important things you can do for your health. Most adults should: Exercise for at least 150 minutes each week. The exercise should increase your heart rate and make you sweat (moderate-intensity exercise). Do strengthening exercises at least twice a week. This is in addition to the moderate-intensity exercise. Spend less time sitting. Even light physical activity can be beneficial. Watch cholesterol and blood lipids Have your blood tested for lipids and cholesterol at 46 years of age, then have this test every 5 years. Have your cholesterol levels checked more often if: Your lipid or cholesterol levels are high. You are older than 46 years of age. You are at high risk for heart disease. What should I know about cancer screening? Depending on your health history and family history, you may need to have cancer screening at various ages. This may include screening for: Breast cancer. Cervical  cancer. Colorectal cancer. Skin cancer. Lung cancer. What should I know about heart disease, diabetes, and high blood pressure? Blood pressure and heart disease High blood pressure causes heart disease and increases the risk of stroke. This is more likely to develop in people who have high blood pressure readings or are overweight. Have your blood pressure checked: Every 3-5 years if you are 49-52 years of age. Every year if you are 16 years old or older. Diabetes Have regular diabetes screenings. This checks your fasting blood sugar level. Have the screening done: Once every three years after age 2 if you are at a normal weight and have a low risk for diabetes. More often and at a younger age if you are overweight or have a high risk for diabetes. What should I know about preventing infection? Hepatitis B If you have a higher risk for hepatitis B, you should be screened for this virus. Talk with your health care provider to find out if you are at risk for hepatitis B infection. Hepatitis C Testing is recommended for: Everyone born from [birth year] through 1963. Anyone with known risk factors for hepatitis C. Sexually transmitted infections (STIs) Get screened for STIs, including gonorrhea and chlamydia, if: You are sexually active and are younger than 46 years of age. You are older than 46 years of age and your health care provider tells you that you are at risk for this type of infection. Your sexual activity has changed since you were last screened, and you are at increased risk for chlamydia or  gonorrhea. Ask your health care provider if you are at risk. Ask your health care provider about whether you are at high risk for HIV. Your health care provider may recommend a prescription medicine to help prevent HIV infection. If you choose to take medicine to prevent HIV, you should first get tested for HIV. You should then be tested every 3 months for as long as you are taking the  medicine. Pregnancy If you are about to stop having your period (premenopausal) and you may become pregnant, seek counseling before you get pregnant. Take 400 to 800 micrograms (mcg) of folic acid every day if you become pregnant. Ask for birth control (contraception) if you want to prevent pregnancy. Osteoporosis and menopause Osteoporosis is a disease in which the bones lose minerals and strength with aging. This can result in bone fractures. If you are 35 years old or older, or if you are at risk for osteoporosis and fractures, ask your health care provider if you should: Be screened for bone loss. Take a calcium or vitamin D supplement to lower your risk of fractures. Be given hormone replacement therapy (HRT) to treat symptoms of menopause. Follow these instructions at home: Alcohol use Do not drink alcohol if: Your health care provider tells you not to drink. You are pregnant, may be pregnant, or are planning to become pregnant. If you drink alcohol: Limit how much you have to: 0-1 drink a day. Know how much alcohol is in your drink. In the [place] one drink equals one 12 oz bottle of beer (355 mL), one 5 oz glass of wine (148 mL), or one 1 oz glass of hard liquor (44 mL). Lifestyle Do not use any products that contain nicotine or tobacco. These products include cigarettes, chewing tobacco, and vaping devices, such as e-cigarettes. If you need help quitting, ask your health care provider. Do not use street drugs. Do not share needles. Ask your health care provider for help if you need support or information about quitting drugs. General instructions Schedule regular health, dental, and eye exams. Stay current with your vaccines. Tell your health care provider if: You often feel depressed. You have ever been abused or do not feel safe at home. Summary Adopting a healthy lifestyle and getting preventive care are important in promoting health and wellness. Follow your health care  provider's instructions about healthy diet, exercising, and getting tested or screened for diseases. Follow your health care provider's instructions on monitoring your cholesterol and blood pressure. This information is not intended to replace advice given to you by your health care provider. Make sure you discuss any questions you have with your health care provider. Document Revised: 03/08/2021 Document Reviewed: 03/08/2021 Elsevier Patient Education  2024 [employer].

## 2024-04-10 NOTE — Assessment & Plan Note (Signed)
 Chronic Continue regular exercise Continue diabetic diet Check A1c

## 2024-04-10 NOTE — Assessment & Plan Note (Signed)
 Chronic Blood pressure borderline high, well-controlled at home Continue valsartan  80 mg daily, hydrochlorothiazide  25 mg daily Monitor BP CBC, CMP

## 2024-04-11 ENCOUNTER — Ambulatory Visit: Payer: Self-pay | Admitting: Internal Medicine

## 2024-04-11 LAB — CBC WITH DIFFERENTIAL/PLATELET
Absolute Lymphocytes: 2345 {cells}/uL (ref 850–3900)
Absolute Monocytes: 631 {cells}/uL (ref 200–950)
Basophils Absolute: 33 {cells}/uL (ref 0–200)
Basophils Relative: 0.4 %
Eosinophils Absolute: 205 {cells}/uL (ref 15–500)
Eosinophils Relative: 2.5 %
HCT: 43.2 % (ref 35.0–45.0)
Hemoglobin: 13.6 g/dL (ref 11.7–15.5)
MCH: 27.3 pg (ref 27.0–33.0)
MCHC: 31.5 g/dL — ABNORMAL LOW (ref 32.0–36.0)
MCV: 86.6 fL (ref 80.0–100.0)
MPV: 11.2 fL (ref 7.5–12.5)
Monocytes Relative: 7.7 %
Neutro Abs: 4986 {cells}/uL (ref 1500–7800)
Neutrophils Relative %: 60.8 %
Platelets: 284 10*3/uL (ref 140–400)
RBC: 4.99 10*6/uL (ref 3.80–5.10)
RDW: 13.6 % (ref 11.0–15.0)
Total Lymphocyte: 28.6 %
WBC: 8.2 10*3/uL (ref 3.8–10.8)

## 2024-04-11 LAB — LIPID PANEL
Cholesterol: 215 mg/dL — ABNORMAL HIGH (ref <200)
HDL: 66 mg/dL (ref >=50)
LDL Cholesterol (Calc): 132 mg/dL — ABNORMAL HIGH
Non-HDL Cholesterol (Calc): 149 mg/dL — ABNORMAL HIGH (ref <130)
Total CHOL/HDL Ratio: 3.3 (calc) (ref <5.0)
Triglycerides: 75 mg/dL (ref <150)

## 2024-04-11 LAB — COMPREHENSIVE METABOLIC PANEL WITH GFR
AG Ratio: 1.6 (calc) (ref 1.0–2.5)
ALT: 25 U/L (ref 6–29)
AST: 20 U/L (ref 10–35)
Albumin: 4.3 g/dL (ref 3.6–5.1)
Alkaline phosphatase (APISO): 68 U/L (ref 31–125)
BUN: 11 mg/dL (ref 7–25)
CO2: 28 mmol/L (ref 20–32)
Calcium: 9.6 mg/dL (ref 8.6–10.2)
Chloride: 99 mmol/L (ref 98–110)
Creat: 0.8 mg/dL (ref 0.50–0.99)
Globulin: 2.7 g/dL (ref 1.9–3.7)
Glucose, Bld: 99 mg/dL (ref 65–99)
Potassium: 4.3 mmol/L (ref 3.5–5.3)
Sodium: 137 mmol/L (ref 135–146)
Total Bilirubin: 0.4 mg/dL (ref 0.2–1.2)
Total Protein: 7 g/dL (ref 6.1–8.1)
eGFR: 93 mL/min/{1.73_m2} (ref >=60)

## 2024-04-11 LAB — HEMOGLOBIN A1C
Hgb A1c MFr Bld: 5.8 % — ABNORMAL HIGH (ref <5.7)
Mean Plasma Glucose: 120 mg/dL
eAG (mmol/L): 6.6 mmol/L

## 2024-04-11 LAB — T3, FREE: T3, Free: 3.1 pg/mL (ref 2.3–4.2)

## 2024-04-11 LAB — T4, FREE: Free T4: 0.9 ng/dL (ref 0.8–1.8)

## 2024-04-11 LAB — TSH: TSH: 0.51 m[IU]/L

## 2024-04-15 ENCOUNTER — Other Ambulatory Visit: Payer: Self-pay | Admitting: Internal Medicine

## 2024-04-15 ENCOUNTER — Encounter: Admitting: Internal Medicine

## 2024-04-30 ENCOUNTER — Other Ambulatory Visit: Payer: Self-pay | Admitting: Internal Medicine

## 2024-05-11 ENCOUNTER — Other Ambulatory Visit: Payer: Self-pay | Admitting: Internal Medicine

## 2024-05-21 ENCOUNTER — Telehealth: Admitting: Family Medicine

## 2024-05-21 DIAGNOSIS — L309 Dermatitis, unspecified: Secondary | ICD-10-CM

## 2024-05-21 MED ORDER — TRIAMCINOLONE ACETONIDE 0.1 % EX CREA: 1.0000 | TOPICAL_CREAM | Freq: Two times a day (BID) | CUTANEOUS | 0 refills | Status: AC

## 2024-05-21 NOTE — Progress Notes (Signed)
 E Visit for Rash  We are sorry that you are not feeling well. Here is how we plan to help!  Based on what you shared with me it looks like you have contact dermatitis.  Contact dermatitis is a skin rash caused by something that touches the skin and causes irritation or inflammation.  Your skin may be red, swollen, dry, cracked, and itch.  The rash should go away in a few days but can last a few weeks.  If you get a rash, it's important to figure out what caused it so the irritant can be avoided in the future. and I am prescribing triamcinolone  0.1 % cream -- apply to the affected area(s) in a thin layer, twice daily for up to 14 days. Do not apply to face, privates or armpit regions.            HOME CARE:  Take cool showers and avoid direct sunlight. Apply cool compress or wet dressings. Take a bath in an oatmeal bath.  Sprinkle content of one Aveeno packet under running faucet with comfortably warm water.  Bathe for 15-20 minutes, 1-2 times daily.  Pat dry with a towel. Do not rub the rash. Use hydrocortisone cream. Take an antihistamine like Benadryl for widespread rashes that itch.  The adult dose of Benadryl is 25-50 mg by mouth 4 times daily. Caution:  This type of medication may cause sleepiness.  Do not drink alcohol , drive, or operate dangerous machinery while taking antihistamines.  Do not take these medications if you have prostate enlargement.  Read package instructions thoroughly on all medications that you take.  GET HELP RIGHT AWAY IF:  Symptoms don't go away after treatment. Severe itching that persists. If you rash spreads or swells. If you rash begins to smell. If it blisters and opens or develops a yellow-brown crust. You develop a fever. You have a sore throat. You become short of breath.  MAKE SURE YOU:  Understand these instructions. Will watch your condition. Will get help right away if you are not doing well or get worse.  Thank you for choosing an  e-visit.  Your e-visit answers were reviewed by a board certified advanced clinical practitioner to complete your personal care plan. Depending upon the condition, your plan could have included both over the counter or prescription medications.  Please review your pharmacy choice. Make sure the pharmacy is open so you can pick up prescription now. If there is a problem, you may contact your provider through [employer] and have the prescription routed to another pharmacy.  Your safety is important to us . If you have drug allergies check your prescription carefully.   For the next 24 hours you can use MyChart to ask questions about today's visit, request a non-urgent call back, or ask for a work or school excuse. You will get an email in the next two days asking about your experience. I hope that your e-visit has been valuable and will speed your recovery.  I provided 5 minutes of non face-to-face time during this encounter for chart review, medication and order placement, as well as and documentation.

## 2024-07-03 ENCOUNTER — Other Ambulatory Visit: Payer: Self-pay | Admitting: Internal Medicine

## 2024-10-01 ENCOUNTER — Telehealth

## 2024-10-01 DIAGNOSIS — J069 Acute upper respiratory infection, unspecified: Secondary | ICD-10-CM

## 2024-10-02 MED ORDER — BENZONATATE 100 MG PO CAPS: 100.0000 mg | ORAL_CAPSULE | Freq: Three times a day (TID) | ORAL | 0 refills | Status: DC | PRN

## 2024-10-02 MED ORDER — IPRATROPIUM BROMIDE 0.03 % NA SOLN: 2.0000 | Freq: Two times a day (BID) | NASAL | 0 refills | Status: AC

## 2024-10-02 NOTE — Progress Notes (Signed)

## 2024-10-11 ENCOUNTER — Ambulatory Visit: Admitting: Internal Medicine

## 2024-10-27 ENCOUNTER — Other Ambulatory Visit: Payer: Self-pay | Admitting: Internal Medicine

## 2024-11-19 ENCOUNTER — Other Ambulatory Visit: Payer: Self-pay | Admitting: Internal Medicine

## 2024-11-24 ENCOUNTER — Encounter: Payer: Self-pay | Admitting: Internal Medicine

## 2024-11-24 NOTE — Progress Notes (Signed)
" ° ° ° ° °  Subjective:    Patient ID: [name], female    DOB: 1976-01-06, 49 y.o.   MRN: [id number]     HPI Chiquitta is here for follow up of her chronic medical problems.  ? [facility]  Medications and allergies reviewed with patient and updated if appropriate.  Medications Ordered Prior to Encounter[1]   Review of Systems     Objective:  There were no vitals filed for this visit. BP Readings from Last 3 Encounters:  04/10/24 130/88  12/07/22 120/78  02/22/22 124/74   Wt Readings from Last 3 Encounters:  04/10/24 177 lb (80.3 kg)  12/07/22 169 lb (76.7 kg)  02/22/22 178 lb (80.7 kg)   There is no height or weight on file to calculate BMI.    Physical Exam     Lab Results  Component Value Date   WBC 8.2 04/10/2024   HGB 13.6 04/10/2024   HCT 43.2 04/10/2024   PLT 284 04/10/2024   GLUCOSE 99 04/10/2024   CHOL 215 (H) 04/10/2024   TRIG 75 04/10/2024   HDL 66 04/10/2024   LDLDIRECT 149.5 09/25/2013   LDLCALC 132 (H) 04/10/2024   ALT 25 04/10/2024   AST 20 04/10/2024   NA 137 04/10/2024   K 4.3 04/10/2024   CL 99 04/10/2024   CREATININE 0.80 04/10/2024   BUN 11 04/10/2024   CO2 28 04/10/2024   TSH 0.51 04/10/2024   HGBA1C 5.8 (H) 04/10/2024     Assessment & Plan:    See Problem List for Assessment and Plan of chronic medical problems.        [1] Current Outpatient Medications on File Prior to Visit  Medication Sig Dispense Refill   ARMOUR THYROID  30 MG tablet Take 30 mg by mouth daily.     ASHWAGANDHA PO Take 1 tablet by mouth daily as needed (blood pressure).     Cholecalciferol (VITAMIN D) 50 MCG (2000 UT) tablet Take 2,000 Units by mouth every other day.     Cyanocobalamin  (B-12) 1000 MCG CAPS Takes one daily (Patient taking differently: Take 1 tablet by mouth daily.)     eszopiclone  (LUNESTA ) 2 MG TABS tablet TAKE 1 TABLET (2 MG TOTAL) BY MOUTH IMMEDIATELY BEFORE BEDTIME AS NEEDED FOR SLEEP 30 tablet 0   Ferrous Sulfate  (IRON PO) Take 1 tablet by mouth every other day.     hydrochlorothiazide  (HYDRODIURIL ) 25 MG tablet TAKE 1 TABLET BY MOUTH EVERY DAY 90 tablet 1   hydrOXYzine (ATARAX) 25 MG tablet Take 25-50 mg by mouth daily as needed.     ipratropium (ATROVENT ) 0.03 % nasal spray Place 2 sprays into both nostrils every 12 (twelve) hours. 30 mL 0   Magnesium  300 MG CAPS Take by mouth.     triamcinolone  cream (KENALOG ) 0.1 % Apply 1 Application topically 2 (two) times daily. 30 g 0   valsartan  (DIOVAN ) 80 MG tablet TAKE 1 TABLET BY MOUTH EVERY DAY 90 tablet 1   No current facility-administered medications on file prior to visit.  This encounter was created in error - please disregard. "

## 2024-11-24 NOTE — Patient Instructions (Addendum)
" ° ° ° ° °  Blood work was ordered.       Medications changes include :   None    A referral was ordered and someone will call you to schedule an appointment.     Return in about 6 months (around 05/27/2025) for Physical Exam.  "

## 2024-11-26 ENCOUNTER — Encounter: Payer: Self-pay | Admitting: Internal Medicine

## 2024-11-26 NOTE — Progress Notes (Unsigned)
 Virtual Visit via Video Note  I connected with [name] on 11/26/24 at  9:30 AM EST by a video enabled telemedicine application and verified that I am speaking with the correct person using two identifiers.   I discussed the limitations of evaluation and management by telemedicine and the availability of in person appointments. The patient expressed understanding and agreed to proceed.  Present for the visit:  Myself, Dr Glade Hope, [name].  The patient is currently at home and I am in the office.    No referring provider.    History of Present Illness: She is here for follow up of her chronic medical conditions.   She is still going to [facility]  She is no longer on the testosterone  pellets.  She is currently on Wegovy the lowest dose of 0.25 mg weekly.  She is starting to see some weight loss.  She had gained a lot of weight over the last several months.   Before going on medication her cholesterol was higher than it has been.   116/82 at home last week.   Sinus pressure, dizzy, left ear feels full.  She wonders if this is related to taking the Lunesta , some valerian root and magnesium  last night.  She denies other cold symptoms.  Anxiety level has been higher.  Review of Systems  Respiratory:  Negative for cough, shortness of breath and wheezing.   Cardiovascular:  Positive for palpitations (occ). Negative for chest pain and leg swelling.  Neurological:  Negative for dizziness and headaches.      Social History   Socioeconomic History   Marital status: Married    Spouse name: Not on file   Number of children: Not on file   Years of education: Not on file   Highest education level: Not on file  Occupational History   Not on file  Tobacco Use   Smoking status: Never   Smokeless tobacco: Never   Tobacco comments:    Married, lives with spouse and 3 children ; [place] and [place].   Substance and Sexual Activity   Alcohol use: No   Drug use: No    Sexual activity: Not on file  Other Topics Concern   Not on file  Social History Narrative   Married, 3 kids   Exercise: personal trainer 3/week, exercises on own 2/week   Not working   Social Drivers of Health   Tobacco Use: Low Risk (11/24/2024)   Patient History    Smoking Tobacco Use: Never    Smokeless Tobacco Use: Never    Passive Exposure: Not on file  Financial Resource Strain: Not on file  Food Insecurity: Not on file  Transportation Needs: Not on file  Physical Activity: Not on file  Stress: Not on file  Social Connections: Not on file  Depression (PHQ2-9): Low Risk (12/07/2022)   Depression (PHQ2-9)    PHQ-2 Score: 1  Alcohol Screen: Not on file  Housing: Not on file  Utilities: Not on file  Health Literacy: Not on file     Observations/Objective: Appears well in NAD Breathing normally Skin appears warm and dry Normal mood, affect  Assessment and Plan:  See Problem List for Assessment and Plan of chronic medical problems.   Follow Up Instructions:    I discussed the assessment and treatment plan with the patient. The patient was provided an opportunity to ask questions and all were answered. The patient agreed with the plan and demonstrated an understanding of the instructions.  The patient was advised to call back or seek an in-person evaluation if the symptoms worsen or if the condition fails to improve as anticipated.    Glade JINNY Hope, MD

## 2024-11-26 NOTE — Patient Instructions (Addendum)
" ° ° ° ° °  Blood work was ordered.       Medications changes include :   None     Return in about 6 months (around 05/27/2025) for Physical Exam.  "

## 2024-11-27 ENCOUNTER — Encounter: Payer: Self-pay | Admitting: Internal Medicine

## 2024-11-27 ENCOUNTER — Telehealth (INDEPENDENT_AMBULATORY_CARE_PROVIDER_SITE_OTHER): Admitting: Internal Medicine

## 2024-11-27 DIAGNOSIS — U071 COVID-19: Secondary | ICD-10-CM | POA: Diagnosis not present

## 2024-11-27 DIAGNOSIS — G4709 Other insomnia: Secondary | ICD-10-CM

## 2024-11-27 DIAGNOSIS — E7800 Pure hypercholesterolemia, unspecified: Secondary | ICD-10-CM

## 2024-11-27 DIAGNOSIS — F410 Panic disorder [episodic paroxysmal anxiety] without agoraphobia: Secondary | ICD-10-CM | POA: Diagnosis not present

## 2024-11-27 DIAGNOSIS — I10 Essential (primary) hypertension: Secondary | ICD-10-CM

## 2024-11-27 DIAGNOSIS — R7303 Prediabetes: Secondary | ICD-10-CM | POA: Diagnosis not present

## 2024-11-27 DIAGNOSIS — F3281 Premenstrual dysphoric disorder: Secondary | ICD-10-CM

## 2024-11-27 DIAGNOSIS — Z1211 Encounter for screening for malignant neoplasm of colon: Secondary | ICD-10-CM

## 2024-11-27 MED ORDER — PROGESTERONE MICRONIZED 100 MG PO CAPS: 100.0000 mg | ORAL_CAPSULE | Freq: Every day | ORAL | Status: AC

## 2024-11-27 MED ORDER — SERTRALINE HCL 100 MG PO TABS: 150.0000 mg | ORAL_TABLET | Freq: Every day | ORAL | 1 refills | Status: AC

## 2024-11-27 MED ORDER — ALPRAZOLAM 0.5 MG PO TABS: 0.2500 mg | ORAL_TABLET | Freq: Every day | ORAL | 5 refills | Status: AC | PRN

## 2024-11-27 MED ORDER — FLUTICASONE PROPIONATE 50 MCG/ACT NA SUSP: 2.0000 | Freq: Every day | NASAL | 6 refills | Status: AC

## 2024-11-27 NOTE — Assessment & Plan Note (Signed)
 Chronic Continue regular exercise and healthy diet Now on Wegovy and has been losing weight which will hopefully help Continue lifestyle control Check lipid panel, CMP, CBC, TSH

## 2024-11-27 NOTE — Assessment & Plan Note (Signed)
 Chronic Improved Taking magnesium  glycinate Continue regular exercise Okay to continue to Lunesta  2 mg nightly as needed

## 2024-11-27 NOTE — Assessment & Plan Note (Signed)
 Chronic  Following with gyn Following up [facility]-currently progesterone  Increase sertraline  to 150 mg daily, continue xanax  0.25-0.5 mg daily prn Continue regular exercise, healthy diet

## 2024-11-27 NOTE — Assessment & Plan Note (Signed)
 Chronic Having increased anxiety Typically worse prior to and during menstrual cycle related to PMDD Continue sertraline  but increase to 150 mg daily Continue alprazolam  0.25-0.5 mg daily as needed

## 2024-11-27 NOTE — Assessment & Plan Note (Signed)
 Chronic Continue regular exercise Continue diabetic diet Check A1c

## 2024-11-27 NOTE — Assessment & Plan Note (Signed)
 Chronic Blood pressure well-controlled at home Continue valsartan  80 mg daily, hydrochlorothiazide  25 mg daily Monitor BP CBC, CMP

## 2024-12-04 ENCOUNTER — Encounter: Payer: Self-pay | Admitting: Internal Medicine

## 2024-12-04 ENCOUNTER — Other Ambulatory Visit: Payer: Self-pay
